# Patient Record
Sex: Male | Born: 1970 | Race: White | Hispanic: No | Smoking: Current some day smoker
Health system: Southern US, Community
[De-identification: ages and names within clinical notes are randomized; demographics above are authoritative.]

## PROBLEM LIST (undated history)

## (undated) DIAGNOSIS — M5412 Radiculopathy, cervical region: Secondary | ICD-10-CM

## (undated) DIAGNOSIS — Z8701 Personal history of pneumonia (recurrent): Secondary | ICD-10-CM

## (undated) DIAGNOSIS — K5792 Diverticulitis of intestine, part unspecified, without perforation or abscess without bleeding: Secondary | ICD-10-CM

## (undated) DIAGNOSIS — M7712 Lateral epicondylitis, left elbow: Secondary | ICD-10-CM

## (undated) DIAGNOSIS — M549 Dorsalgia, unspecified: Secondary | ICD-10-CM

## (undated) DIAGNOSIS — F32A Depression, unspecified: Secondary | ICD-10-CM

## (undated) DIAGNOSIS — I10 Essential (primary) hypertension: Secondary | ICD-10-CM

## (undated) DIAGNOSIS — M7711 Lateral epicondylitis, right elbow: Secondary | ICD-10-CM

## (undated) DIAGNOSIS — F329 Major depressive disorder, single episode, unspecified: Secondary | ICD-10-CM

## (undated) HISTORY — DX: Personal history of pneumonia (recurrent): Z87.01

## (undated) HISTORY — DX: Lateral epicondylitis, left elbow: M77.12

## (undated) HISTORY — DX: Dorsalgia, unspecified: M54.9

## (undated) HISTORY — DX: Depression, unspecified: F32.A

## (undated) HISTORY — DX: Lateral epicondylitis, right elbow: M77.11

## (undated) HISTORY — DX: Major depressive disorder, single episode, unspecified: F32.9

## (undated) HISTORY — DX: Essential (primary) hypertension: I10

## (undated) HISTORY — DX: Radiculopathy, cervical region: M54.12

## (undated) HISTORY — PX: TONSILLECTOMY: SHX5217

---

## 2009-06-07 DIAGNOSIS — F329 Major depressive disorder, single episode, unspecified: Secondary | ICD-10-CM

## 2009-06-07 DIAGNOSIS — F32A Depression, unspecified: Secondary | ICD-10-CM | POA: Insufficient documentation

## 2012-07-17 LAB — LIPID PANEL
Cholesterol: 170 mg/dL (ref 0–200)
HDL: 44 mg/dL (ref 35–70)
LDL Cholesterol: 100 mg/dL
Triglycerides: 129 mg/dL (ref 40–160)

## 2012-12-02 ENCOUNTER — Emergency Department: Payer: Self-pay | Admitting: Emergency Medicine

## 2012-12-02 LAB — COMPREHENSIVE METABOLIC PANEL
Alkaline Phosphatase: 76 U/L (ref 50–136)
BUN: 16 mg/dL (ref 7–18)
Bilirubin,Total: 0.4 mg/dL (ref 0.2–1.0)
Calcium, Total: 8.7 mg/dL (ref 8.5–10.1)
Creatinine: 1.14 mg/dL (ref 0.60–1.30)
EGFR (Non-African Amer.): >60
Glucose: 100 mg/dL — ABNORMAL HIGH (ref 65–99)
Osmolality: 275 (ref 275–301)
SGOT(AST): 24 U/L (ref 15–37)
SGPT (ALT): 27 U/L (ref 12–78)
Total Protein: 7.3 g/dL (ref 6.4–8.2)

## 2012-12-02 LAB — CBC
HGB: 15.8 g/dL (ref 13.0–18.0)
MCHC: 34.8 g/dL (ref 32.0–36.0)
MCV: 92 fL (ref 80–100)
Platelet: 226 10*3/uL (ref 150–440)
RBC: 4.98 10*6/uL (ref 4.40–5.90)
WBC: 9 10*3/uL (ref 3.8–10.6)

## 2014-06-28 ENCOUNTER — Other Ambulatory Visit: Payer: Self-pay | Admitting: Family Medicine

## 2014-06-30 ENCOUNTER — Ambulatory Visit (INDEPENDENT_AMBULATORY_CARE_PROVIDER_SITE_OTHER): Payer: BC Managed Care – PPO | Admitting: Family Medicine

## 2014-06-30 ENCOUNTER — Encounter: Payer: Self-pay | Admitting: Family Medicine

## 2014-06-30 ENCOUNTER — Encounter (INDEPENDENT_AMBULATORY_CARE_PROVIDER_SITE_OTHER): Payer: Self-pay

## 2014-06-30 VITALS — BP 134/88 | HR 71 | Temp 98.2°F | Resp 18 | Ht 67.75 in | Wt 185.8 lb

## 2014-06-30 DIAGNOSIS — M5412 Radiculopathy, cervical region: Secondary | ICD-10-CM | POA: Diagnosis not present

## 2014-06-30 DIAGNOSIS — Z113 Encounter for screening for infections with a predominantly sexual mode of transmission: Secondary | ICD-10-CM | POA: Diagnosis not present

## 2014-06-30 DIAGNOSIS — K625 Hemorrhage of anus and rectum: Secondary | ICD-10-CM | POA: Diagnosis not present

## 2014-06-30 DIAGNOSIS — Z8701 Personal history of pneumonia (recurrent): Secondary | ICD-10-CM | POA: Insufficient documentation

## 2014-06-30 DIAGNOSIS — Z720 Tobacco use: Secondary | ICD-10-CM | POA: Insufficient documentation

## 2014-06-30 DIAGNOSIS — M549 Dorsalgia, unspecified: Secondary | ICD-10-CM

## 2014-06-30 DIAGNOSIS — Z8679 Personal history of other diseases of the circulatory system: Secondary | ICD-10-CM | POA: Insufficient documentation

## 2014-06-30 DIAGNOSIS — M7710 Lateral epicondylitis, unspecified elbow: Secondary | ICD-10-CM | POA: Insufficient documentation

## 2014-06-30 DIAGNOSIS — Z125 Encounter for screening for malignant neoplasm of prostate: Secondary | ICD-10-CM | POA: Diagnosis not present

## 2014-06-30 DIAGNOSIS — Z0000 Encounter for general adult medical examination without abnormal findings: Secondary | ICD-10-CM

## 2014-06-30 DIAGNOSIS — G8929 Other chronic pain: Secondary | ICD-10-CM | POA: Insufficient documentation

## 2014-06-30 MED ORDER — CYCLOBENZAPRINE HCL 10 MG PO TABS: 10.0000 mg | ORAL_TABLET | Freq: Every day | ORAL | Status: DC

## 2014-06-30 NOTE — Progress Notes (Signed)
Name: Richard CrochetChristopher L Cimmino   MRN: [id number]    DOB: February 01, 1970   Date:06/30/2014       Progress Note  Subjective  Chief Complaint  Chief Complaint  Patient presents with  . Annual Exam    HPI  CPE: he is now seeing a new lady, and she has a history of herpes, he wants to be checked for STI's before becoming sexually active with her. He has a more sedentary job and has gained some weight.   Cervical Radiculitis: chronic, has daily neck pain, pain at this time is 5/10. Pain radiates to the upper back, pain is dull, sometimes he has numbness on left index finger, but no arm weakness, or coordination problems.  He takes Flexeril to help with spasms on neck and back and would like a refill  Patient Active Problem List   Diagnosis Date Noted  . Backhand tennis elbow 06/30/2014  . Back ache 06/30/2014  . Cervical radiculitis 06/30/2014  . H/O: pneumonia 06/30/2014  . History of hypertension 06/30/2014  . Compulsive tobacco user syndrome 06/30/2014  . Bleeding per rectum 06/30/2014    Past Surgical History  Procedure Laterality Date  . Tonsillectomy      Family History  Problem Relation Age of Onset  . Heart disease Mother   . Cancer Father     Gallbladder  . Hypertension Father     History   Social History  . Marital Status: Divorced    Spouse Name: N/A  . Number of Children: N/A  . Years of Education: N/A   Occupational History  . Not on file.   Social History Main Topics  . Smoking status: Current Some Day Smoker -- 0.25 packs/day for 4 years    Types: Cigarettes    Start date: 06/29/2009  . Smokeless tobacco: Never Used  . Alcohol Use: 4.8 oz/week    8 Standard drinks or equivalent per week  . Drug Use: No  . Sexual Activity:    Partners: Female   Other Topics Concern  . Not on file   Social History Narrative    No current outpatient prescriptions on file.  No Known Allergies   ROS  Constitutional: Negative for fever, gained some  weight Respiratory: Negative for cough and shortness of breath.   Cardiovascular: Negative for chest pain or palpitations.  Gastrointestinal: Negative for abdominal pain, no bowel changes.  Musculoskeletal: Negative for gait problem or joint swelling. back and neck pain  Skin: Negative for rash.  Neurological: Negative for dizziness or headache.  No other specific complaints in a complete review of systems (except as listed in HPI above).  Objective  Filed Vitals:   06/30/14 1357  BP: 134/88  Pulse: 71  Temp: 98.2 F (36.8 C)  TempSrc: Oral  Resp: 18  Height: 5' 7.75" (1.721 m)  Weight: 185 lb 12.8 oz (84.278 kg)  SpO2: 98%    Physical Exam   Constitutional: Patient appears well-developed and well-nourished. No distress.  HENT: Head: Normocephalic and atraumatic. Ears: B TMs ok, no erythema or effusion; Nose: Nose normal. Mouth/Throat: Oropharynx is clear and moist. No oropharyngeal exudate.  Eyes: Conjunctivae and EOM are normal. Pupils are equal, round, and reactive to light. No scleral icterus.  Neck: Normal range of motion. Neck supple. No JVD present. No thyromegaly present.  Cardiovascular: Normal rate, regular rhythm and normal heart sounds.  No murmur heard. No BLE edema. Pulmonary/Chest: Effort normal and breath sounds normal. No respiratory distress. Abdominal: Soft. Bowel sounds are  normal, no distension. There is no tenderness. no masses MALE GENITALIA: Normal descended testes bilaterally, no masses palpated, no hernias, no lesions, no discharge RECTAL: Prostate normal size and consistency, no rectal masses or hemorrhoids Musculoskeletal: Normal range of motion, no joint effusions. No gross deformities Neurological: he is alert and oriented to person, place, and time. No cranial nerve deficit. Coordination, balance, strength, speech and gait are normal.  Skin: Skin is warm and dry. No rash noted. No erythema.  Psychiatric: Patient has a normal mood and affect.  behavior is normal. Judgment and thought content normal.    Depression screen PHQ 2/9 06/30/2014  Decreased Interest 0  Down, Depressed, Hopeless 0  PHQ - 2 Score 0   Fall Risk: Fall Risk  06/30/2014  Falls in the past year? No    Assessment & Plan   1. Physical exam, annual Discussed importance of 150 minutes of physical activity weekly, eat two servings of fish weekly, eat one serving of tree nuts ( cashews, pistachios, pecans, almonds.Marland Kitchen) every other day, eat 6 servings of fruit/vegetables daily and drink plenty of water and avoid sweet beverages.  - Lipid Profile - Glucose  2. Bleeding per rectum Did not go see Dr. Lars Pinks, explained the need to have colonoscopy, he states has a new job, and will re-schedule when he can  3. Cervical radiculitis Refill of his flexeril   4. Routine screening for STI (sexually transmitted infection) Check labs, needs to use condoms.  - HIV antibody (with reflex) - HSV(herpes simplex vrs) 1+2 ab-IgG - RPR - GC Probe amplification, urine  5. Prostate cancer screening  Discussed current USPTF guidelines during CPE He does not want to have PSA today

## 2014-06-30 NOTE — Addendum Note (Signed)
Addended by: Alba CorySOWLES, Aleksa Collinsworth F on: 06/30/2014 03:32 PM   Modules accepted: Level of Service, SmartSet

## 2014-07-01 LAB — HSV(HERPES SIMPLEX VRS) I + II AB-IGG: HSV 1 GLYCOPROTEIN G AB, IGG: 3.51 {index} — AB (ref 0.00–0.90)

## 2014-07-01 LAB — LIPID PANEL
Chol/HDL Ratio: 5.6 ratio units — ABNORMAL HIGH (ref 0.0–5.0)
Cholesterol, Total: 173 mg/dL (ref 100–199)
HDL: 31 mg/dL — ABNORMAL LOW (ref >39)
LDL Calculated: 82 mg/dL (ref 0–99)
Triglycerides: 299 mg/dL — ABNORMAL HIGH (ref 0–149)
VLDL Cholesterol Cal: 60 mg/dL — ABNORMAL HIGH (ref 5–40)

## 2014-07-01 LAB — RPR: RPR: NONREACTIVE

## 2014-07-01 LAB — GLUCOSE, RANDOM: Glucose: 93 mg/dL (ref 65–99)

## 2014-07-01 LAB — HIV ANTIBODY (ROUTINE TESTING W REFLEX): HIV Screen 4th Generation wRfx: NONREACTIVE

## 2014-07-01 NOTE — Addendum Note (Signed)
Addended by: Alba CorySOWLES, Haruo Stepanek F on: 07/01/2014 01:27 PM   Modules accepted: Level of Service, SmartSet

## 2014-07-05 LAB — SPECIMEN STATUS REPORT

## 2014-07-05 LAB — GC/CHLAMYDIA PROBE AMP
Chlamydia trachomatis, NAA: NEGATIVE
NEISSERIA GONORRHOEAE BY PCR: NEGATIVE

## 2014-07-06 NOTE — Progress Notes (Signed)
[employer] and ask them to fax [place] the results, they are still pending in our system.

## 2014-07-07 ENCOUNTER — Telehealth: Payer: Self-pay

## 2014-07-07 NOTE — Telephone Encounter (Signed)
-----   Message from Alba Cory, MD sent at 07/06/2014  7:57 PM EDT ----- Other labs already reviewed.  Genprobe neg Please notify patient, thank you

## 2014-07-07 NOTE — Telephone Encounter (Signed)
Patient notified of labs.   

## 2015-01-01 ENCOUNTER — Ambulatory Visit: Payer: BC Managed Care – PPO | Admitting: Family Medicine

## 2015-01-15 ENCOUNTER — Encounter: Payer: Self-pay | Admitting: Family Medicine

## 2015-01-15 ENCOUNTER — Ambulatory Visit (INDEPENDENT_AMBULATORY_CARE_PROVIDER_SITE_OTHER): Payer: BC Managed Care – PPO | Admitting: Family Medicine

## 2015-01-15 VITALS — BP 128/86 | HR 75 | Temp 98.6°F | Resp 16 | Ht 68.0 in | Wt 193.7 lb

## 2015-01-15 DIAGNOSIS — M549 Dorsalgia, unspecified: Secondary | ICD-10-CM

## 2015-01-15 DIAGNOSIS — E663 Overweight: Secondary | ICD-10-CM | POA: Diagnosis not present

## 2015-01-15 DIAGNOSIS — M5412 Radiculopathy, cervical region: Secondary | ICD-10-CM

## 2015-01-15 DIAGNOSIS — E8881 Metabolic syndrome: Secondary | ICD-10-CM

## 2015-01-15 DIAGNOSIS — K625 Hemorrhage of anus and rectum: Secondary | ICD-10-CM

## 2015-01-15 DIAGNOSIS — G8929 Other chronic pain: Secondary | ICD-10-CM

## 2015-01-15 MED ORDER — CYCLOBENZAPRINE HCL 10 MG PO TABS: 10.0000 mg | ORAL_TABLET | Freq: Every day | ORAL | Status: DC

## 2015-01-15 NOTE — Patient Instructions (Signed)
Diet for Metabolic Syndrome Metabolic syndrome is a disorder that includes at least three of these conditions:  Abdominal obesity.  Too much sugar in your blood.  High blood pressure.  Higher than normal amount of fat (lipids) in your blood.  Lower than normal level of "good" cholesterol (HDL). Following a healthy diet can help to keep metabolic syndrome under control. It can also help to prevent the development of conditions that are associated with metabolic syndrome, such as diabetes, heart disease, and stroke. Along with exercise, a healthy diet:  Helps to improve the way that the body uses insulin.  Promotes weight loss. A common goal for people with this condition is to lose at least 7 to 10 percent of their starting weight. WHAT DO I NEED TO KNOW ABOUT THIS DIET?  Use the glycemic index (GI) to plan your meals. The index tells you how quickly a food will raise your blood sugar. Choose foods that have low GI values. These foods take a longer time to raise blood sugar.  Keep track of how many calories you take in. Eating the right amount of calories will help your achieve a healthy weight.  You may want to follow a Mediterranean diet. This diet includes lots of vegetables, lean meats or fish, whole grains, fruits, and healthy oils and fats. WHAT FOODS CAN I EAT? Grains Stone-ground whole wheat. Pumpernickel bread. Whole-grain bread, crackers, tortillas, cereal, and pasta. Unsweetened oatmeal.Bulgur.Barley.Quinoa.Brown rice or wild rice. Vegetables Lettuce. Spinach. Peas. Beets. Cauliflower. Cabbage. Broccoli. Carrots. Tomatoes. Squash. Eggplant. Herbs. Peppers. Onions. Cucumbers. Brussels sprouts. Sweet potatoes. Yams. Beans. Lentils. Fruits Berries. Apples. Oranges. Grapes. Mango. Pomegranate. Kiwi. Cherries. Meats and Other Protein Sources Seafood and shellfish. Lean meats.Poultry. Tofu. Dairy Low-fat or fat-free dairy products, such as milk, yogurt, and  cheese. Beverages Water. Low-fat milk. Milk alternatives, like soy milk or almond milk. Real fruit juice. Condiments Low-sugar or sugar-free ketchup, barbecue sauce, and mayonnaise. Mustard. Relish. Fats and Oils Avocado. Canola or olive oil. Nuts and nut butters.Seeds. The items listed above may not be a complete list of recommended foods or beverages. Contact your dietitian for more options.  WHAT FOODS ARE NOT RECOMMENDED? Red meat. Palm oil and coconut oil. Processed foods. Fried foods. Alcohol. Sweetened drinks, such as iced tea and soda. Sweets. Salty foods. The items listed above may not be a complete list of foods and beverages to avoid. Contact your dietitian for more information.   This information is not intended to replace advice given to you by your health care provider. Make sure you discuss any questions you have with your health care provider.   Document Released: 05/26/2014 Document Reviewed: 05/26/2014 Elsevier Interactive Patient Education 2016 [employer] Inc.  

## 2015-01-15 NOTE — Progress Notes (Signed)
Name: Richard Wilkerson   MRN: [id number]    DOB: 1970-07-17   Date:01/15/2015       Progress Note  Subjective  Chief Complaint  Chief Complaint  Patient presents with  . Medication Refill    6 month F/U   . Neck Pain    Only takes muscle relaxers as needed, comes from job from repeatively motions.   . Rectal Bleeding    Still has small amounts of blood from rectal every once in awhile, when lifting or carrying something too heavy.    HPI  Rectal bleeding: intermittently for the past year. He states it happens when he wipes or on toilett bowel , it does not seem to be mixed in stools. He denies straining, no constipation, Bristol scale of 4. He has not lost weight and has normal appetite. He has noticed some LLQ pain. No fever, chills.   Chronic back pain : he has mild daily pain on lower back, no radiculitis, pain is described as aching like, 2/10.   Neck pain : radiculitis has resolved, has some pain on left neck, feels stiff, mild right now, takes flexeril prn  Metabolic Syndrome: Low HDL, elevated BMI and elevated triglycerides. He has gained more weight since last visit. Not following a healthy diet and is not physically active. No polyphagia, polyuria or polydipsia  Patient Active Problem List   Diagnosis Date Noted  . Overweight (BMI 25.0-29.9) 01/15/2015  . Backhand tennis elbow 06/30/2014  . Chronic back pain 06/30/2014  . Cervical radiculitis 06/30/2014  . H/O: pneumonia 06/30/2014  . History of hypertension 06/30/2014  . Tobacco use 06/30/2014  . Bleeding per rectum 06/30/2014    Past Surgical History  Procedure Laterality Date  . Tonsillectomy      Family History  Problem Relation Age of Onset  . Heart disease Mother   . Cancer Father     Gallbladder  . Hypertension Father     Social History   Social History  . Marital Status: Divorced    Spouse Name: N/A  . Number of Children: N/A  . Years of Education: N/A   Occupational History  . Not on  file.   Social History Main Topics  . Smoking status: Current Some Day Smoker -- 0.25 packs/day for 4 years    Types: Cigarettes    Start date: 06/29/2009  . Smokeless tobacco: Never Used  . Alcohol Use: 4.8 oz/week    8 Standard drinks or equivalent per week  . Drug Use: No  . Sexual Activity:    Partners: Female   Other Topics Concern  . Not on file   Social History Narrative     Current outpatient prescriptions:  .  cyclobenzaprine (FLEXERIL) 10 MG tablet, Take 1 tablet (10 mg total) by mouth at bedtime., Disp: 90 tablet, Rfl: 1  No Known Allergies   ROS  Constitutional: Negative for fever, positive  weight change.  Respiratory: Negative for cough and shortness of breath.   Cardiovascular: Negative for chest pain or palpitations.  Gastrointestinal: Positive  for abdominal pain, denies change of  bowel changes.  Musculoskeletal: Negative for gait problem or joint swelling.  Skin: Negative for rash.  Neurological: Negative for dizziness or headache.  No other specific complaints in a complete review of systems (except as listed in HPI above).  Objective  Filed Vitals:   01/15/15 1612  BP: 128/86  Pulse: 75  Temp: 98.6 F (37 C)  TempSrc: Oral  Resp: 16  Height:  5\' 8"  (1.727 m)  Weight: 193 lb 11.2 oz (87.862 kg)  SpO2: 96%    Body mass index is 29.46 kg/(m^2).  Physical Exam  Constitutional: Patient appears well-developed and well-nourished. Overweight No distress.  HEENT: head atraumatic, normocephalic, pupils equal and reactive to light, neck supple, throat within normal limits Cardiovascular: Normal rate, regular rhythm and normal heart sounds.  No murmur heard. No BLE edema. Pulmonary/Chest: Effort normal and breath sounds normal. No respiratory distress. Abdominal: Soft.  There is tenderness on LLQ during palpation  Psychiatric: Patient has a normal mood and affect. behavior is normal. Judgment and thought content normal Muscular Skeletal: normal  back exam and neck exam today  PHQ2/9: [facility] 01/15/2015 06/30/2014  Decreased Interest 0 0  Down, Depressed, Hopeless 0 0  PHQ - 2 Score 0 0    Fall Risk: Fall Risk  01/15/2015 06/30/2014  Falls in the past year? No No    Functional Status Survey: Is the patient deaf or have difficulty hearing?: No Does the patient have difficulty seeing, even when wearing glasses/contacts?: No Does the patient have difficulty concentrating, remembering, or making decisions?: No Does the patient have difficulty walking or climbing stairs?: No Does the patient have difficulty dressing or bathing?: No Does the patient have difficulty doing errands alone such as visiting a doctor's office or shopping?: No   Assessment & Plan  1. Bleeding per rectum  - Ambulatory referral to Gastroenterology  2. Overweight (BMI 25.0-29.9)  Discussed diet and exercise  3. Chronic back pain  - cyclobenzaprine (FLEXERIL) 10 MG tablet; Take 1 tablet (10 mg total) by mouth at bedtime.  Dispense: 90 tablet; Refill: 1  4. Cervical radiculitis  Stable   5. Metabolic syndrome  Discussed diet and exercise, he needs to lose weight

## 2015-02-18 ENCOUNTER — Ambulatory Visit (INDEPENDENT_AMBULATORY_CARE_PROVIDER_SITE_OTHER): Payer: BC Managed Care – PPO | Admitting: Gastroenterology

## 2015-02-18 ENCOUNTER — Other Ambulatory Visit: Payer: Self-pay

## 2015-02-18 ENCOUNTER — Encounter (INDEPENDENT_AMBULATORY_CARE_PROVIDER_SITE_OTHER): Payer: Self-pay

## 2015-02-18 ENCOUNTER — Encounter: Payer: Self-pay | Admitting: Gastroenterology

## 2015-02-18 VITALS — BP 140/85 | HR 64 | Ht 68.0 in | Wt 196.0 lb

## 2015-02-18 DIAGNOSIS — K921 Melena: Secondary | ICD-10-CM | POA: Diagnosis not present

## 2015-02-18 NOTE — Progress Notes (Signed)
Gastroenterology Consultation  Referring Provider:     Alba Cory, MD Primary Care Physician:  Ruel Favors, MD Primary Gastroenterologist:  Dr. Servando Snare     Reason for Consultation:     Rectal bleeding        HPI:   DELVECCHIO Wilkerson is a 45 y.o. y/o male referred for consultation & management of  Rectal bleeding by Dr. Ruel Favors, MD.   This patient reports that he has been having rectal bleeding off and on for six months. The patient denies any constipation associated with the rectal bleeding. He states that there blood is usually bright red that can be darker at times. There is no report of any unexplained weight loss. The patient also denies any abdominal pain or black stools. He denies any family history of colon cancer colon polyps. The patient cannot recall any exacerbating were relieving things that he can do to make the rectal bleeding occur or stop. He denies any rectal bleeding when he is not having a bowel movement.  Past Medical History  Diagnosis Date  . Hypertension   . Depression   . Cervical radiculopathy   . Back pain   . Bilateral tennis elbow   . History of pneumonia     Past Surgical History  Procedure Laterality Date  . Tonsillectomy      Prior to Admission medications   Not on File    Family History  Problem Relation Age of Onset  . Heart disease Mother   . Cancer Father     Gallbladder  . Hypertension Father      Social History  Substance Use Topics  . Smoking status: Current Some Day Smoker -- 0.25 packs/day for 4 years    Types: Cigarettes    Start date: 06/29/2009  . Smokeless tobacco: Never Used  . Alcohol Use: 4.8 oz/week    8 Standard drinks or equivalent per week    Allergies as of 02/18/2015  . (No Known Allergies)    Review of Systems:    All systems reviewed and negative except where noted in HPI.   Physical Exam:  BP 140/85 mmHg  Pulse 64  Ht  (1.727 m)  Wt 196 lb (88.905 kg)  BMI 29.81 kg/m2 No  LMP for male patient. Psych:  Alert and cooperative. Normal mood and affect. General:   Alert,  Well-developed, well-nourished, pleasant and cooperative in NAD Head:  Normocephalic and atraumatic. Eyes:  Sclera clear, no icterus.   Conjunctiva pink. Ears:  Normal auditory acuity. Nose:  No deformity, discharge, or lesions. Mouth:  No deformity or lesions,oropharynx pink & moist. Neck:  Supple; no masses or thyromegaly. Lungs:  Respirations even and unlabored.  Clear throughout to auscultation.   No wheezes, crackles, or rhonchi. No acute distress. Heart:  Regular rate and rhythm; no murmurs, clicks, rubs, or gallops. Abdomen:  Normal bowel sounds.  No bruits.  Soft, non-tender and non-distended without masses, hepatosplenomegaly or hernias noted.  No guarding or rebound tenderness.  Negative Carnett sign.   Rectal:  Deferred.  Msk:  Symmetrical without gross deformities.  Good, equal movement & strength bilaterally. Pulses:  Normal pulses noted. Extremities:  No clubbing or edema.  No cyanosis. Neurologic:  Alert and oriented x3;  grossly normal neurologically. Skin:  Intact without significant lesions or rashes.  No jaundice. Lymph Nodes:  No significant cervical adenopathy. Psych:  Alert and cooperative. Normal mood and affect.  Imaging Studies: No results found.  Assessment  and Plan:   Richard Wilkerson is a 45 y.o. y/o male who comes in today with a history of six months of rectal bleeding. The patient denies any family history of colon cancer or polyps. He also denies any change in bowel habits or unexplained weight loss. The patient will be set up for colonoscopy to evaluate the colon for source of his rectal bleeding. The patient has been explained the planet agrees with it. I have discussed risks & benefits which include, but are not limited to, bleeding, infection, perforation & drug reaction.  The patient agrees with this plan & written consent will be obtained.      Note:  This dictation was prepared with Dragon dictation along with smaller phrase technology. Any transcriptional errors that result from this process are unintentional.

## 2015-03-15 ENCOUNTER — Encounter: Admission: RE | Payer: Self-pay | Source: Ambulatory Visit

## 2015-03-15 ENCOUNTER — Ambulatory Visit
Admission: RE | Admit: 2015-03-15 | Payer: BC Managed Care – PPO | Source: Ambulatory Visit | Admitting: Gastroenterology

## 2015-03-15 SURGERY — COLONOSCOPY WITH PROPOFOL
Anesthesia: Choice

## 2015-04-16 ENCOUNTER — Ambulatory Visit: Payer: BC Managed Care – PPO | Admitting: Family Medicine

## 2015-07-01 ENCOUNTER — Telehealth: Payer: Self-pay | Admitting: Family Medicine

## 2015-07-01 NOTE — Telephone Encounter (Signed)
ERRONOUS  

## 2018-10-09 ENCOUNTER — Ambulatory Visit
Admission: RE | Admit: 2018-10-09 | Discharge: 2018-10-09 | Disposition: A | Discharge status: Home / Self Care | Payer: BC Managed Care – PPO | Source: Ambulatory Visit | Attending: Gastroenterology | Admitting: Gastroenterology

## 2018-10-09 ENCOUNTER — Other Ambulatory Visit: Payer: Self-pay | Admitting: Gastroenterology

## 2018-10-09 ENCOUNTER — Other Ambulatory Visit: Payer: Self-pay

## 2018-10-09 DIAGNOSIS — R1032 Left lower quadrant pain: Secondary | ICD-10-CM | POA: Diagnosis not present

## 2018-10-09 MED ORDER — IOHEXOL 300 MG/ML  SOLN
100.0000 mL | Freq: Once | INTRAMUSCULAR | Status: AC | PRN
  Administered 2018-10-09: 100 mL via INTRAVENOUS

## 2018-12-09 ENCOUNTER — Other Ambulatory Visit: Payer: Self-pay

## 2018-12-09 ENCOUNTER — Other Ambulatory Visit
Admission: RE | Admit: 2018-12-09 | Discharge: 2018-12-09 | Disposition: A | Discharge status: Home / Self Care | Payer: BC Managed Care – PPO | Source: Ambulatory Visit | Attending: Surgery | Admitting: Surgery

## 2018-12-09 DIAGNOSIS — Z01812 Encounter for preprocedural laboratory examination: Secondary | ICD-10-CM | POA: Insufficient documentation

## 2018-12-09 DIAGNOSIS — Z20828 Contact with and (suspected) exposure to other viral communicable diseases: Secondary | ICD-10-CM | POA: Insufficient documentation

## 2018-12-09 LAB — SARS CORONAVIRUS 2 (TAT 6-24 HRS): SARS Coronavirus 2: NEGATIVE

## 2018-12-12 ENCOUNTER — Ambulatory Visit: Payer: BC Managed Care – PPO | Admitting: Certified Registered Nurse Anesthetist

## 2018-12-12 ENCOUNTER — Ambulatory Visit
Admission: RE | Admit: 2018-12-12 | Discharge: 2018-12-12 | Disposition: A | Discharge status: Home / Self Care | Payer: BC Managed Care – PPO | Attending: Surgery | Admitting: Surgery

## 2018-12-12 ENCOUNTER — Encounter
Admission: RE | Disposition: A | Discharge status: Home / Self Care | Payer: Self-pay | Source: Home / Self Care | Attending: Surgery

## 2018-12-12 ENCOUNTER — Other Ambulatory Visit: Payer: Self-pay

## 2018-12-12 ENCOUNTER — Encounter: Payer: Self-pay | Admitting: *Deleted

## 2018-12-12 DIAGNOSIS — K635 Polyp of colon: Secondary | ICD-10-CM | POA: Insufficient documentation

## 2018-12-12 DIAGNOSIS — I10 Essential (primary) hypertension: Secondary | ICD-10-CM | POA: Insufficient documentation

## 2018-12-12 DIAGNOSIS — K640 First degree hemorrhoids: Secondary | ICD-10-CM | POA: Diagnosis not present

## 2018-12-12 DIAGNOSIS — K5720 Diverticulitis of large intestine with perforation and abscess without bleeding: Secondary | ICD-10-CM | POA: Insufficient documentation

## 2018-12-12 DIAGNOSIS — F17210 Nicotine dependence, cigarettes, uncomplicated: Secondary | ICD-10-CM | POA: Insufficient documentation

## 2018-12-12 HISTORY — PX: COLONOSCOPY WITH PROPOFOL: SHX5780

## 2018-12-12 SURGERY — COLONOSCOPY WITH PROPOFOL
Anesthesia: General

## 2018-12-12 MED ORDER — MIDAZOLAM HCL 2 MG/2ML IJ SOLN
INTRAMUSCULAR | Status: AC
  Filled 2018-12-12: qty 2

## 2018-12-12 MED ORDER — PROPOFOL 500 MG/50ML IV EMUL
INTRAVENOUS | Status: DC | PRN
  Administered 2018-12-12: 130 ug/kg/min via INTRAVENOUS

## 2018-12-12 MED ORDER — LIDOCAINE HCL (PF) 2 % IJ SOLN
INTRAMUSCULAR | Status: AC
  Filled 2018-12-12: qty 10

## 2018-12-12 MED ORDER — PROPOFOL 10 MG/ML IV BOLUS
INTRAVENOUS | Status: DC | PRN
  Administered 2018-12-12: 80 mg via INTRAVENOUS

## 2018-12-12 MED ORDER — PROPOFOL 500 MG/50ML IV EMUL
INTRAVENOUS | Status: AC
  Filled 2018-12-12: qty 50

## 2018-12-12 MED ORDER — MIDAZOLAM HCL 2 MG/2ML IJ SOLN
INTRAMUSCULAR | Status: DC | PRN
  Administered 2018-12-12: 2 mg via INTRAVENOUS

## 2018-12-12 MED ORDER — LIDOCAINE HCL (CARDIAC) PF 100 MG/5ML IV SOSY
PREFILLED_SYRINGE | INTRAVENOUS | Status: DC | PRN
  Administered 2018-12-12: 50 mg via INTRAVENOUS

## 2018-12-12 MED ORDER — SODIUM CHLORIDE 0.9 % IV SOLN
INTRAVENOUS | Status: DC
  Administered 2018-12-12: 1000 mL via INTRAVENOUS

## 2018-12-12 NOTE — Interval H&P Note (Signed)
History and Physical Interval Note:  12/12/2018 12:34 PM  Richard Wilkerson  has presented today for surgery, with the diagnosis of DIVERTICULITIS WITH ABSCESS.  The various methods of treatment have been discussed with the patient and family. After consideration of risks, benefits and other options for treatment, the patient has consented to  Procedure(s): COLONOSCOPY WITH PROPOFOL (N/A) as a surgical intervention.  The patient's history has been reviewed, patient examined, no change in status, stable for surgery.  I have reviewed the patient's chart and labs.  Questions were answered to the patient's satisfaction.     Dail Lerew Lysle Pearl

## 2018-12-12 NOTE — Anesthesia Preprocedure Evaluation (Signed)
Anesthesia Evaluation  Patient identified by MRN, date of birth, ID band Patient awake    Reviewed: Allergy & Precautions, H&P , NPO status , Patient's Chart, lab work & pertinent test results, reviewed documented beta blocker date and time   History of Anesthesia Complications Negative for: history of anesthetic complications  Airway Mallampati: I  TM Distance: >3 FB Neck ROM: full    Dental  (+) Dental Advidsory Given, Caps, Teeth Intact, Missing   Pulmonary neg shortness of breath, neg COPD, neg recent URI, Current Smoker,    Pulmonary exam normal        Cardiovascular Exercise Tolerance: Good hypertension, (-) angina(-) Past MI and (-) Cardiac Stents Normal cardiovascular exam(-) dysrhythmias (-) Valvular Problems/Murmurs     Neuro/Psych PSYCHIATRIC DISORDERS Depression negative neurological ROS     GI/Hepatic negative GI ROS, Neg liver ROS,   Endo/Other  negative endocrine ROS  Renal/GU negative Renal ROS  negative genitourinary   Musculoskeletal   Abdominal   Peds  Hematology negative hematology ROS (+)   Anesthesia Other Findings Past Medical History: No date: Back pain No date: Bilateral tennis elbow No date: Cervical radiculopathy No date: Depression No date: History of pneumonia No date: Hypertension   Reproductive/Obstetrics negative OB ROS                             Anesthesia Physical Anesthesia Plan  ASA: II  Anesthesia Plan: General   Post-op Pain Management:    Induction: Intravenous  PONV Risk Score and Plan: 1 and Propofol infusion and TIVA  Airway Management Planned: Natural Airway and Nasal Cannula  Additional Equipment:   Intra-op Plan:   Post-operative Plan:   Informed Consent: I have reviewed the patients History and Physical, chart, labs and discussed the procedure including the risks, benefits and alternatives for the proposed anesthesia with  the patient or authorized representative who has indicated his/her understanding and acceptance.     Dental Advisory Given  Plan Discussed with: Anesthesiologist, CRNA and Surgeon  Anesthesia Plan Comments:         Anesthesia Quick Evaluation

## 2018-12-12 NOTE — Anesthesia Post-op Follow-up Note (Signed)
Anesthesia QCDR form completed.        

## 2018-12-12 NOTE — Transfer of Care (Signed)
Immediate Anesthesia Transfer of Care Note  Patient: Richard Wilkerson  Procedure(s) Performed: COLONOSCOPY WITH PROPOFOL (N/A )  Patient Location: PACU and Endoscopy Unit  Anesthesia Type:General  Level of Consciousness: drowsy  Airway & Oxygen Therapy: Patient Spontanous Breathing and Patient connected to nasal cannula oxygen  Post-op Assessment: Report given to RN and Post -op Vital signs reviewed and stable  Post vital signs: Reviewed and stable  Last Vitals:  Vitals Value Taken Time  BP 126/91 12/12/18 1313  Temp 36.1 C 12/12/18 1313  Pulse 84 12/12/18 1316  Resp 21 12/12/18 1316  SpO2 96 % 12/12/18 1316  Vitals shown include unvalidated device data.  Last Pain:  Vitals:   12/12/18 1313  TempSrc: Temporal         Complications: No apparent anesthesia complications

## 2018-12-12 NOTE — Anesthesia Postprocedure Evaluation (Signed)
Anesthesia Post Note  Patient: Richard Wilkerson  Procedure(s) Performed: COLONOSCOPY WITH PROPOFOL (N/A )  Patient location during evaluation: Endoscopy Anesthesia Type: General Level of consciousness: awake and alert Pain management: pain level controlled Vital Signs Assessment: post-procedure vital signs reviewed and stable Respiratory status: spontaneous breathing, nonlabored ventilation, respiratory function stable and patient connected to nasal cannula oxygen Cardiovascular status: blood pressure returned to baseline and stable Postop Assessment: no apparent nausea or vomiting Anesthetic complications: no     Last Vitals:  Vitals:   12/12/18 1205 12/12/18 1313  BP: (!) 125/98 (!) 126/91  Pulse: 69   Resp: 16   Temp: (!) 36.2 C (!) 36.1 C  SpO2: 100%     Last Pain:  Vitals:   12/12/18 1323  TempSrc:   PainSc: 0-No pain                 Martha Clan

## 2018-12-12 NOTE — Op Note (Signed)
[facility] Gastroenterology Patient Name: Richard PilgrimChristopher Wilkerson Procedure Date: 12/12/2018 11:07 AM MRN: [id number] Account #: [id number] Date of Birth: [birth year] Admit Type: Outpatient Age: [age] [facility] Gender: Male Note Status: Finalized Procedure:             Colonoscopy Indications:           Follow-up of diverticulitis Providers:             Arville GoIsami Sakia MD, MD Medicines:             Propofol per Anesthesia Complications:         No immediate complications. Procedure:             Pre-Anesthesia Assessment:                        - After reviewing the risks and benefits, the patient                         was deemed in satisfactory condition to undergo the                         procedure in an ambulatory setting.                        After obtaining informed consent, the colonoscope was                         passed under direct vision. Throughout the procedure,                         the patient's blood pressure, pulse, and oxygen                         saturations were monitored continuously. The                         Colonoscope was introduced through the anus and                         advanced to the the cecum, identified by the ileocecal                         valve. The colonoscopy was performed without                         difficulty. The patient tolerated the procedure well.                         The quality of the bowel preparation was good. Findings:      The perianal and digital rectal examinations were normal.      Multiple small and large-mouthed diverticula were found in the sigmoid       colon and descending colon.      Non-bleeding internal hemorrhoids were found during retroflexion. The       hemorrhoids were Grade I (internal hemorrhoids that do not prolapse).      A 3 mm polyp was found in the sigmoid colon. The polyp was sessile. The       polyp was removed with a cold snare. Resection and  retrieval were    complete.      The exam was otherwise without abnormality. Impression:            - Diverticulosis in the sigmoid colon and in the                         descending colon.                        - Non-bleeding internal hemorrhoids.                        - One 3 mm polyp in the sigmoid colon, removed with a                         cold snare. Resected and retrieved.                        - The examination was otherwise normal. Recommendation:        - Await pathology results.                        - Written discharge instructions were provided to the                         patient.                        - Resume regular diet.                        - Discharge patient to home. Procedure Code(s):     --- Professional ---                        [phone], Colonoscopy, flexible; with removal of                         tumor(s), polyp(s), or other lesion(s) by snare                         technique Diagnosis Code(s):     --- Professional ---                        K64.0, First degree hemorrhoids                        K63.5, Polyp of colon                        K57.32, Diverticulitis of large intestine without                         perforation or abscess without bleeding                        K57.30, Diverticulosis of large intestine without                         perforation or abscess without bleeding CPT copyright 2019 American Medical Association. All rights reserved. The codes documented in this report are preliminary and upon coder  review may  be revised to meet current compliance requirements. Dr. Sheppard Penton, MD Eliseo Squires MD, MD 12/12/2018 1:11:47 PM This report has been signed electronically. Number of Addenda: 0 Note Initiated On: 12/12/2018 11:07 AM Scope Withdrawal Time: 0 hours 14 minutes 7 seconds  Total Procedure Duration: 0 hours 21 minutes 27 seconds  Estimated Blood Loss:  Estimated blood loss was minimal.      [facility]

## 2018-12-12 NOTE — H&P (Signed)
Subjective:   CC: Diverticulitis of large intestine with abscess without bleeding [K57.20]  HPI: Richard Wilkerson is a 48 y.o. male who was referred by Dewayne Hatch,* for evaluation of above. First noted several weeks ago. Required admission, IR drain for abscess, and another round of outpt abx due to persistent  symptoms.  Completed second course week ago, still has vague discomfort in area, but otherwise tolerating diet, and having normal BMs  Past Medical History: has a past medical history of Chicken pox.  Past Surgical History: has a past surgical history that includes Tonsillectomy and corneal eye surgery.  Family History: family history includes No Known Problems in his father and mother.  Social History: reports that he has been smoking cigarettes. He has never used smokeless tobacco. He reports current alcohol use. He reports that he does not use drugs.  Current Medications: has a current medication list which includes the following prescription(s): docusate.  Allergies:  No Known Allergies  ROS:  A 15 point review of systems was performed and pertinent positives and negatives noted in HPI  Objective:    BP 120/88  Pulse 85  Ht 172.7 cm (5\' 8" )  Wt 82.1 kg (181 lb)  BMI 27.52 kg/m   Constitutional : alert, appears stated age, cooperative and no distress  Lymphatics/Throat: no asymmetry, masses, or scars  Respiratory: clear to auscultation bilaterally  Cardiovascular: regular rate and rhythm  Gastrointestinal: soft, non-tender; bowel sounds normal; no masses, no organomegaly.  Musculoskeletal: Steady gait and movement  Skin: Cool and moist.  Psychiatric: Normal affect, non-agitated, not confused    LABS:  n/a  RADS: n/a  Assessment:    Diverticulitis of large intestine with abscess without bleeding [K57.20]  Plan:   symptoms essentially resolved. Will schedule for interval cscope sometime mid-late Nov. ED precautions discussed in  meantime. Risks include bleeding, perforation. Alternatives include observation. Benefits include diagnosis of occult disease if present.  He verbalized understanding and wishes to proceed with colonoscopy.

## 2018-12-13 ENCOUNTER — Encounter: Payer: Self-pay | Admitting: Surgery

## 2018-12-13 LAB — SURGICAL PATHOLOGY

## 2019-06-16 ENCOUNTER — Inpatient Hospital Stay
Admission: EM | Admit: 2019-06-16 | Discharge: 2019-06-19 | DRG: 392 | Disposition: A | Discharge status: Home / Self Care | Payer: BC Managed Care – PPO | Attending: Surgery | Admitting: Surgery

## 2019-06-16 ENCOUNTER — Other Ambulatory Visit: Payer: Self-pay

## 2019-06-16 ENCOUNTER — Encounter: Payer: Self-pay | Admitting: Emergency Medicine

## 2019-06-16 ENCOUNTER — Emergency Department: Payer: BC Managed Care – PPO

## 2019-06-16 DIAGNOSIS — F17210 Nicotine dependence, cigarettes, uncomplicated: Secondary | ICD-10-CM | POA: Diagnosis present

## 2019-06-16 DIAGNOSIS — Z8249 Family history of ischemic heart disease and other diseases of the circulatory system: Secondary | ICD-10-CM

## 2019-06-16 DIAGNOSIS — Z20822 Contact with and (suspected) exposure to covid-19: Secondary | ICD-10-CM | POA: Diagnosis present

## 2019-06-16 DIAGNOSIS — K5720 Diverticulitis of large intestine with perforation and abscess without bleeding: Principal | ICD-10-CM | POA: Diagnosis present

## 2019-06-16 DIAGNOSIS — I10 Essential (primary) hypertension: Secondary | ICD-10-CM | POA: Diagnosis present

## 2019-06-16 DIAGNOSIS — K5792 Diverticulitis of intestine, part unspecified, without perforation or abscess without bleeding: Secondary | ICD-10-CM | POA: Diagnosis present

## 2019-06-16 DIAGNOSIS — Z8701 Personal history of pneumonia (recurrent): Secondary | ICD-10-CM

## 2019-06-16 DIAGNOSIS — R1030 Lower abdominal pain, unspecified: Secondary | ICD-10-CM

## 2019-06-16 DIAGNOSIS — K5732 Diverticulitis of large intestine without perforation or abscess without bleeding: Secondary | ICD-10-CM

## 2019-06-16 HISTORY — DX: Diverticulitis of intestine, part unspecified, without perforation or abscess without bleeding: K57.92

## 2019-06-16 LAB — CBC
HCT: 45.9 % (ref 39.0–52.0)
Hemoglobin: 15.7 g/dL (ref 13.0–17.0)
MCH: 31.1 pg (ref 26.0–34.0)
MCHC: 34.2 g/dL (ref 30.0–36.0)
MCV: 90.9 fL (ref 80.0–100.0)
Platelets: 202 10*3/uL (ref 150–400)
RBC: 5.05 MIL/uL (ref 4.22–5.81)
RDW: 11.6 % (ref 11.5–15.5)
WBC: 12.5 10*3/uL — ABNORMAL HIGH (ref 4.0–10.5)
nRBC: 0 % (ref 0.0–0.2)

## 2019-06-16 LAB — COMPREHENSIVE METABOLIC PANEL
ALT: 13 U/L (ref 0–44)
AST: 15 U/L (ref 15–41)
Albumin: 4.2 g/dL (ref 3.5–5.0)
Alkaline Phosphatase: 71 U/L (ref 38–126)
Anion gap: 7 (ref 5–15)
BUN: 15 mg/dL (ref 6–20)
CO2: 25 mmol/L (ref 22–32)
Calcium: 8.8 mg/dL — ABNORMAL LOW (ref 8.9–10.3)
Chloride: 104 mmol/L (ref 98–111)
Creatinine, Ser: 1.11 mg/dL (ref 0.61–1.24)
GFR calc Af Amer: >60 mL/min (ref >60)
GFR calc non Af Amer: >60 mL/min (ref >60)
Glucose, Bld: 114 mg/dL — ABNORMAL HIGH (ref 70–99)
Potassium: 4.4 mmol/L (ref 3.5–5.1)
Sodium: 136 mmol/L (ref 135–145)
Total Bilirubin: 0.8 mg/dL (ref 0.3–1.2)
Total Protein: 7.2 g/dL (ref 6.5–8.1)

## 2019-06-16 LAB — URINALYSIS, COMPLETE (UACMP) WITH MICROSCOPIC
Bacteria, UA: NONE SEEN
Bilirubin Urine: NEGATIVE
Glucose, UA: NEGATIVE mg/dL
Hgb urine dipstick: NEGATIVE
Ketones, ur: NEGATIVE mg/dL
Leukocytes,Ua: NEGATIVE
Nitrite: NEGATIVE
Protein, ur: NEGATIVE mg/dL
Specific Gravity, Urine: 1.024 (ref 1.005–1.030)
Squamous Epithelial / HPF: NONE SEEN (ref 0–5)
pH: 7 (ref 5.0–8.0)

## 2019-06-16 LAB — HIV ANTIBODY (ROUTINE TESTING W REFLEX): HIV Screen 4th Generation wRfx: NONREACTIVE

## 2019-06-16 LAB — LIPASE, BLOOD: Lipase: 21 U/L (ref 11–51)

## 2019-06-16 LAB — SARS CORONAVIRUS 2 BY RT PCR (HOSPITAL ORDER, PERFORMED IN ~~LOC~~ HOSPITAL LAB): SARS Coronavirus 2: NEGATIVE

## 2019-06-16 MED ORDER — ACETAMINOPHEN 325 MG PO TABS
650.0000 mg | ORAL_TABLET | Freq: Four times a day (QID) | ORAL | Status: DC | PRN
  Administered 2019-06-19: 650 mg via ORAL
  Filled 2019-06-16: qty 2

## 2019-06-16 MED ORDER — ONDANSETRON HCL 4 MG/2ML IJ SOLN: 4.0000 mg | Freq: Four times a day (QID) | INTRAMUSCULAR | Status: DC | PRN

## 2019-06-16 MED ORDER — CIPROFLOXACIN IN D5W 400 MG/200ML IV SOLN
400.0000 mg | Freq: Once | INTRAVENOUS | Status: AC
  Administered 2019-06-16: 400 mg via INTRAVENOUS
  Filled 2019-06-16: qty 200

## 2019-06-16 MED ORDER — ENOXAPARIN SODIUM 40 MG/0.4ML ~~LOC~~ SOLN
40.0000 mg | SUBCUTANEOUS | Status: DC
  Filled 2019-06-16: qty 0.4

## 2019-06-16 MED ORDER — SODIUM CHLORIDE 0.9 % IV SOLN: INTRAVENOUS | Status: DC

## 2019-06-16 MED ORDER — PIPERACILLIN-TAZOBACTAM 3.375 G IVPB
3.3750 g | Freq: Three times a day (TID) | INTRAVENOUS | Status: DC
  Administered 2019-06-16 – 2019-06-19 (×9): 3.375 g via INTRAVENOUS
  Filled 2019-06-16 (×9): qty 50

## 2019-06-16 MED ORDER — MORPHINE SULFATE (PF) 4 MG/ML IV SOLN: 4.0000 mg | INTRAVENOUS | Status: DC | PRN

## 2019-06-16 MED ORDER — ONDANSETRON 4 MG PO TBDP: 4.0000 mg | ORAL_TABLET | Freq: Four times a day (QID) | ORAL | Status: DC | PRN

## 2019-06-16 MED ORDER — METRONIDAZOLE IN NACL 5-0.79 MG/ML-% IV SOLN
500.0000 mg | Freq: Once | INTRAVENOUS | Status: AC
  Administered 2019-06-16: 500 mg via INTRAVENOUS
  Filled 2019-06-16: qty 100

## 2019-06-16 MED ORDER — PANTOPRAZOLE SODIUM 40 MG IV SOLR
40.0000 mg | Freq: Every day | INTRAVENOUS | Status: DC
  Administered 2019-06-16 – 2019-06-18 (×3): 40 mg via INTRAVENOUS
  Filled 2019-06-16 (×3): qty 40

## 2019-06-16 MED ORDER — IOHEXOL 300 MG/ML  SOLN
100.0000 mL | Freq: Once | INTRAMUSCULAR | Status: AC | PRN
  Administered 2019-06-16: 100 mL via INTRAVENOUS
  Filled 2019-06-16: qty 100

## 2019-06-16 MED ORDER — SODIUM CHLORIDE 0.9% FLUSH: 3.0000 mL | Freq: Once | INTRAVENOUS | Status: DC

## 2019-06-16 MED ORDER — HYDROCODONE-ACETAMINOPHEN 5-325 MG PO TABS
1.0000 | ORAL_TABLET | ORAL | Status: DC | PRN
  Administered 2019-06-18: 1 via ORAL
  Filled 2019-06-16: qty 1

## 2019-06-16 MED ORDER — ACETAMINOPHEN 650 MG RE SUPP: 650.0000 mg | Freq: Four times a day (QID) | RECTAL | Status: DC | PRN

## 2019-06-16 NOTE — ED Notes (Signed)
Pt given meal tray at this time 

## 2019-06-16 NOTE — ED Notes (Signed)
Pt transported to CT at this time.

## 2019-06-16 NOTE — ED Notes (Signed)
2nd abx initiated by this RN at this time, pt repositioned in bed by this RN. Pt given urinal. Pt denies further needs. Pt visualized in NAD at this time.

## 2019-06-16 NOTE — ED Notes (Signed)
Pt states had diverticulitis last year and had surgery, reports same pain that has started again, denies N/V/D, reports pain 5/10 that is sharp in nature. Denies urinary symptoms at this time. A&O x4, ambulatory from lobby to room with this RN. Lights dimmed for comfort, pt given remote to TV, call bell within reach of patient at this time.

## 2019-06-16 NOTE — ED Provider Notes (Signed)
[facility] Emergency Department Provider Note  ____________________________________________   First MD Initiated Contact with Patient 06/16/19 1159     (approximate)  I have reviewed the triage vital signs and the nursing notes.  History  Chief Complaint Abdominal Pain    HPI Richard Wilkerson is a 49 y.o. male past medical history as below, notable for diverticulitis with perforation and abscess in August 2020, requiring IR drain, who presents to the emergency department for lower abdominal pain similar to prior episodes of diverticulitis.  This pain started several days ago and has been constant since onset.  He describes it as a sharp pain.  Located to the lower abdomen, mostly central. 5/10 in severity.  No radiation.  Worsened with movement and certain positional changes, improved with rest.  Denies any associated fevers, nausea, vomiting.  Does report some slight increased difficulty with bowel movements (constipation).  States symptoms feel similar to his prior episode of diverticulitis.  Denies any associated dysuria, hematuria, or history of nephrolithiasis.   Past Medical Hx Past Medical History:  Diagnosis Date  . Back pain   . Bilateral tennis elbow   . Cervical radiculopathy   . Depression   . Diverticulitis   . History of pneumonia   . Hypertension     Problem List Patient Active Problem List   Diagnosis Date Noted  . Overweight (BMI 25.0-29.9) 01/15/2015  . Backhand tennis elbow 06/30/2014  . Chronic back pain 06/30/2014  . Cervical radiculitis 06/30/2014  . H/O: pneumonia 06/30/2014  . History of hypertension 06/30/2014  . Tobacco use 06/30/2014  . Bleeding per rectum 06/30/2014    Past Surgical Hx Past Surgical History:  Procedure Laterality Date  . COLONOSCOPY WITH PROPOFOL N/A 12/12/2018   Procedure: COLONOSCOPY WITH PROPOFOL;  Surgeon: Sung Amabile, DO;  Location: [facility] ENDOSCOPY;  Service: General;  Laterality: N/A;    . TONSILLECTOMY      Medications Prior to Admission medications   Not on File    Allergies Patient has no known allergies.  Family Hx Family History  Problem Relation Age of Onset  . Heart disease Mother   . Cancer Father        Gallbladder  . Hypertension Father     Social Hx Social History   Tobacco Use  . Smoking status: Current Some Day Smoker    Packs/day: 0.25    Years: 4.00    Pack years: 1.00    Types: Cigarettes    Start date: 06/29/2009  . Smokeless tobacco: Never Used  Substance Use Topics  . Alcohol use: Yes    Alcohol/week: 8.0 standard drinks    Types: 8 Standard drinks or equivalent per week  . Drug use: No     Review of Systems  Constitutional: Negative for fever. Negative for chills. Eyes: Negative for visual changes. ENT: Negative for sore throat. Cardiovascular: Negative for chest pain. Respiratory: Negative for shortness of breath. Gastrointestinal: Positive for abdominal pain. Genitourinary: Negative for dysuria. Musculoskeletal: Negative for leg swelling. Skin: Negative for rash. Neurological: Negative for headaches.   Physical Exam  Vital Signs: ED Triage Vitals  Enc Vitals Group     BP 06/16/19 0939 (!) 125/95     Pulse Rate 06/16/19 0939 78     Resp 06/16/19 0939 16     Temp 06/16/19 0939 98.7 F (37.1 C)     Temp Source 06/16/19 0939 Oral     SpO2 06/16/19 0939 98 %     Weight  06/16/19 0937 169 lb 15.6 oz (77.1 kg)     Height 06/16/19 0937 5\' 8"  (1.727 m)     Head Circumference --      Peak Flow --      Pain Score 06/16/19 0937 5     Pain Loc --      Pain Edu? --      Excl. in [place]? --     Constitutional: Alert and oriented. Well appearing. NAD.  Head: Normocephalic. Atraumatic. Eyes: Conjunctivae clear. Sclera anicteric. Pupils equal and symmetric. Nose: No masses or lesions. No congestion or rhinorrhea. Mouth/Throat: Wearing mask.  Neck: No stridor. Trachea midline.  Cardiovascular: Normal rate, regular rhythm.  Extremities well perfused. Respiratory: Normal respiratory effort.  Lungs CTAB. Gastrointestinal: Soft. Non-distended.  Mild TTP in the lower central abdomen, no rebounding, guarding, rigidity.  Remainder of abdomen is soft nontender.  Bowel sounds present. Genitourinary: Deferred. Musculoskeletal: No lower extremity edema. No deformities. Neurologic:  Normal speech and language. No gross focal or lateralizing neurologic deficits are appreciated.  Skin: Skin is warm, dry and intact. No rash noted. Psychiatric: Mood and affect are appropriate for situation.    Radiology  Personally reviewed available imaging myself.   CT A/P - IMPRESSION:  1. Severe sigmoid colon diverticulitis with diverticular abscess, as  detailed above. Surgical consultation is recommended.    Procedures  Procedure(s) performed (including critical care):  Procedures   Initial Impression / Assessment and Plan / MDM / ED Course  49 y.o. male who presents to the ED for lower abdominal pain, similar to prior episodes of diverticulitis  Ddx: recurrent diverticulitis, colitis, UTI/cystitis, nephrolithiasis  Will plan for labs, imaging  Work up reveals mild leukocytosis to 12.5 and imaging reveals severe sigmoid diverticulitis with abscess.  IV antibiotics ordered. Discussed with surgery. Will admit. Updated patient on results and plan of care, he voices understanding and is in agreement.    _______________________________   As part of my medical decision making I have reviewed available labs, radiology tests, reviewed old records/performed chart review, discussed w/ consultants (general surgery).    Final Clinical Impression(s) / ED Diagnosis  Final diagnoses:  Lower abdominal pain  Diverticulitis  Abscess of sigmoid colon due to diverticulitis       Note:  This document was prepared using Dragon voice recognition software and may include unintentional dictation errors.   Lilia Pro.,  MD 06/16/19 1331

## 2019-06-16 NOTE — ED Notes (Signed)
Called for transport

## 2019-06-16 NOTE — H&P (Signed)
SURGICAL HISTORY AND PHYSICAL NOTE   HISTORY OF PRESENT ILLNESS (HPI):  49 y.o. male presented to [facility] ED for evaluation of abdominal pain.  He reports that the pain started 3 to 4 days ago.  Pain localized in the lower abdomen on the midportion.  There is no pain radiation to other part of the body.  Aggravating factor is applying pressure.  Alleviating factor is pain medication given at the ED.  Patient denies any fever or chills.   At the ED he had white blood cell count that shows leukocytosis.  CT scan of the abdomen shows diverticulitis with 3 cm abscess, pericolonic.  I personally evaluated the images.  Patient reports having history of similar episode a year ago treated at [facility].  He had percutaneous drainage placed.  Then was treated with IV antibiotic therapy and transition to oral antibiotic therapy.  He had a colonoscopy on November 2020.  No sign of malignancy.  I personally evaluated the report and images of the colonoscopy.  Surgery is consulted by Dr. Colon Branch in this context for evaluation and management of acute diverticulitis with abscess.  PAST MEDICAL HISTORY (PMH):  Past Medical History:  Diagnosis Date  . Back pain   . Bilateral tennis elbow   . Cervical radiculopathy   . Depression   . Diverticulitis   . History of pneumonia   . Hypertension      PAST SURGICAL HISTORY (PSH):  Past Surgical History:  Procedure Laterality Date  . COLONOSCOPY WITH PROPOFOL N/A 12/12/2018   Procedure: COLONOSCOPY WITH PROPOFOL;  Surgeon: Sung Amabile, DO;  Location: [facility] ENDOSCOPY;  Service: General;  Laterality: N/A;  . TONSILLECTOMY       MEDICATIONS:  Prior to Admission medications   Not on File     ALLERGIES:  No Known Allergies   SOCIAL HISTORY:  Social History   Socioeconomic History  . Marital status: Divorced    Spouse name: Not on file  . Number of children: Not on file  . Years of education: Not on file  . Highest education level: Not on file  Occupational  History  . Not on file  Tobacco Use  . Smoking status: Current Some Day Smoker    Packs/day: 0.25    Years: 4.00    Pack years: 1.00    Types: Cigarettes    Start date: 06/29/2009  . Smokeless tobacco: Never Used  Substance and Sexual Activity  . Alcohol use: Yes    Alcohol/week: 8.0 standard drinks    Types: 8 Standard drinks or equivalent per week  . Drug use: No  . Sexual activity: Yes    Partners: Female  Other Topics Concern  . Not on file  Social History Narrative  . Not on file   Social Determinants of Health   Financial Resource Strain:   . Difficulty of Paying Living Expenses:   Food Insecurity:   . Worried About Programme researcher, broadcasting/film/video in the Last Year:   . Barista in the Last Year:   Transportation Needs:   . Freight forwarder (Medical):   Marland Kitchen Lack of Transportation (Non-Medical):   Physical Activity:   . Days of Exercise per Week:   . Minutes of Exercise per Session:   Stress:   . Feeling of Stress :   Social Connections:   . Frequency of Communication with Friends and Family:   . Frequency of Social Gatherings with Friends and Family:   . Attends Religious Services:   .  Active Member of Clubs or Organizations:   . Attends Banker Meetings:   Marland Kitchen Marital Status:   Intimate Partner Violence:   . Fear of Current or Ex-Partner:   . Emotionally Abused:   Marland Kitchen Physically Abused:   . Sexually Abused:       FAMILY HISTORY:  Family History  Problem Relation Age of Onset  . Heart disease Mother   . Cancer Father        Gallbladder  . Hypertension Father      REVIEW OF SYSTEMS:  Constitutional: denies weight loss, fever, chills, or sweats  Eyes: denies any other vision changes, history of eye injury  ENT: denies sore throat, hearing problems  Respiratory: denies shortness of breath, wheezing  Cardiovascular: denies chest pain, palpitations  Gastrointestinal: Positive abdominal pain, Negative nausea and vomiting Genitourinary: denies  burning with urination or urinary frequency Musculoskeletal: denies any other joint pains or cramps  Skin: denies any other rashes or skin discolorations  Neurological: denies any other headache, dizziness, weakness  Psychiatric: denies any other depression, anxiety   All other review of systems were negative   VITAL SIGNS:  Temp:  [98.7 F (37.1 C)] 98.7 F (37.1 C) (05/24 0939) Pulse Rate:  [55-78] 65 (05/24 1429) Resp:  [16-20] 20 (05/24 1429) BP: (123-142)/(82-97) 142/97 (05/24 1429) SpO2:  [98 %-99 %] 99 % (05/24 1429) Weight:  [77.1 kg] 77.1 kg (05/24 0937)     Height: 5\' 8"  (172.7 cm) Weight: 77.1 kg BMI (Calculated): 25.85   INTAKE/OUTPUT:  This shift: No intake/output data recorded.  Last 2 shifts: @IOLAST2SHIFTS @   PHYSICAL EXAM:  Constitutional:  -- Normal body habitus  -- Awake, alert, and oriented x3  Eyes:  -- Pupils equally round and reactive to light  -- No scleral icterus  Ear, nose, and throat:  -- No jugular venous distension  Pulmonary:  -- No crackles  -- Equal breath sounds bilaterally -- Breathing non-labored at rest Cardiovascular:  -- S1, S2 present  -- No pericardial rubs Gastrointestinal:  -- Abdomen soft, mild tender to palpation mid abdomen, non-distended, no guarding or rebound tenderness -- No abdominal masses appreciated, pulsatile or otherwise  Musculoskeletal and Integumentary:  -- Wounds: None appreciated -- Extremities: B/L UE and LE FROM, hands and feet warm, no edema  Neurologic:  -- Motor function: intact and symmetric -- Sensation: intact and symmetric   Labs:  CBC Latest Ref Rng & Units 06/16/2019 12/02/2012  WBC 4.0 - 10.5 K/uL 12.5(H) 9.0  Hemoglobin 13.0 - 17.0 g/dL 06/18/2019 13/10/2012  Hematocrit 42.5 - 52.0 % 45.9 45.6  Platelets 150 - 400 K/uL 202 226   CMP Latest Ref Rng & Units 06/16/2019 06/30/2014 12/02/2012  Glucose 70 - 99 mg/dL 08/30/2014) 93 13/10/2012)  BUN 6 - 20 mg/dL 15 - 16  Creatinine 564(P - 1.24 mg/dL 329(J - 1.88   Sodium 4.16 - 145 mmol/L 136 - 137  Potassium 3.5 - 5.1 mmol/L 4.4 - 4.0  Chloride 98 - 111 mmol/L 104 - 106  CO2 22 - 32 mmol/L 25 - 30  Calcium 8.9 - 10.3 mg/dL 6.06) - 8.7  Total Protein 6.5 - 8.1 g/dL 7.2 - 7.3  Total Bilirubin 0.3 - 1.2 mg/dL 0.8 - 0.4  Alkaline Phos 38 - 126 U/L 71 - 76  AST 15 - 41 U/L 15 - 24  ALT 0 - 44 U/L 13 - 27     Imaging studies:  EXAM: CT ABDOMEN AND PELVIS WITH CONTRAST  TECHNIQUE: Multidetector CT imaging of the abdomen and pelvis was performed using the standard protocol following bolus administration of intravenous contrast.  CONTRAST:  166mL OMNIPAQUE IOHEXOL 300 MG/ML  SOLN  COMPARISON:  CT of the abdomen and pelvis 10/09/2018.  FINDINGS: Lower chest: Unremarkable.  Hepatobiliary: No suspicious cystic or solid hepatic lesions. No intra or extrahepatic biliary ductal dilatation. Gallbladder is normal in appearance.  Pancreas: No pancreatic mass. No pancreatic ductal dilatation. No pancreatic or peripancreatic fluid collections or inflammatory changes.  Spleen: Unremarkable.  Adrenals/Urinary Tract: Bilateral kidneys and adrenal glands are normal in appearance. No hydroureteronephrosis. Urinary bladder is normal in appearance.  Stomach/Bowel: Normal appearance of the stomach. No pathologic dilatation of small bowel or colon. Numerous colonic diverticulae are noted, particularly in the sigmoid colon. In the region of the mid sigmoid colon and adjacent sigmoid mesocolon there are extensive inflammatory changes indicative of an acute diverticulitis. In addition, axial image 66 of series 2 and coronal image 38 of series 5 demonstrates a well-defined 3.5 x 2.3 x 3.2 cm low-attenuation rim enhancing lesion containing fluid and gas within the superior aspect of the sigmoid mesocolon, indicative of a diverticular abscess. No definitive findings of frank perforation or confidently identified at this time. Normal  appendix.  Vascular/Lymphatic: No significant atherosclerotic disease, aneurysm or dissection noted in the abdominal or pelvic vasculature. No lymphadenopathy noted in the abdomen or pelvis.  Reproductive: Prostate gland and seminal vesicles are unremarkable in appearance.  Other: No significant volume of ascites.  No pneumoperitoneum.  Musculoskeletal: There are no aggressive appearing lytic or blastic lesions noted in the visualized portions of the skeleton.  IMPRESSION: 1. Severe sigmoid colon diverticulitis with diverticular abscess, as detailed above. Surgical consultation is recommended.   Electronically Signed   By: Vinnie Langton M.D.   On: 06/16/2019 12:59  Assessment/Plan:  49 y.o. male with complicated diverticulitis with pericolonic abscess, complicated by pertinent comorbidities including hypertension, previous episode of diverticulitis.  Patient with second episode of acute diverticulitis with pericolonic abscess.  Currently patient stable without peritoneal sign.  Due to leukocytosis and finding of abscess I recommend patient to treat as inpatient with IV antibiotic therapy.  I discussed the case with IR who feels that the abscess is in a very difficult position to be reach.  He did mention that if needed they can bring the patient down to the IR suite to scan him to see if there is a window for drainage.  At this moment there is no emergent surgery or procedure that needs to be done.  Will admit patient to surgical service for IV antibiotic therapy.  We will place patient on clear liquid diet.  We will start DVT prophylaxis.   Arnold Long, MD

## 2019-06-16 NOTE — ED Triage Notes (Signed)
C/O lower abdominal pain x 2-3 days.  Pain similar to episode of diverticulitis.  AAOx3.  Skin warm and dry. NAD

## 2019-06-17 ENCOUNTER — Encounter: Payer: Self-pay | Admitting: General Surgery

## 2019-06-17 MED ORDER — SODIUM CHLORIDE 0.9 % IV SOLN
INTRAVENOUS | Status: DC | PRN
  Administered 2019-06-17: 250 mL via INTRAVENOUS

## 2019-06-17 NOTE — Progress Notes (Signed)
Reported by Odis Hollingshead, NT that patient had refused using the urinal; instructed patient in the importance of monitoring I/O; that it was required my his Doctor; voiced understanding; urinal within reach. Windy Carina, RN6:52 AM 06/17/2019

## 2019-06-17 NOTE — Consult Note (Signed)
Chief Complaint: Patient was seen in consultation today for diverticular abscess.  Referring Physician(s): Sung Amabile  Supervising Physician: Malachy Moan  Patient Status: [facility] - In-pt  History of Present Illness: Richard Wilkerson is a 49 y.o. male with a past medical history significant for depression, HTN and diverticulitis with previous intra-abdominal abscess and drain placement 08/31/18 (removed 8/31) at [facility] who presented to the ED yesterday with complaints of lower abdominal pain x 2-3 days which felt similar to previous episodes of diverticulitis. Initial work up in the ED noted patient to be afebrile, mildly hypertensive, WBC 12.5. CT A/P with contrast was performed which noted severe sigmoid colon diverticulitis with a well-defined 3.5 x 2.3 x 3.2 cm low-attenuation rim enhancing lesion containing fluid and gas within the superior aspect of the sigmoid mesocolon indicative of diverticular abscess. General surgery was consulted for admission, upon their evaluation it was determined that no emergent surgery was indicated and plan was to begin IV antibiotics. IR has been consulted for possible percutaneous abscess and drain placement.  Richard Wilkerson was seen in his room, he reports continued abdominal pain which is about the same as when he arrived - he notes that this episode is different than his previous bout of diverticulitis because before he really only had pain with certain movements whereas this time the pain is more constant and present when he is just laying in bed. He reports fairly regular bowel movements but they are often small and hard to pass, he previously used stool softeners but is not sure if they were helpful. He is wondering when he will need surgery, when he can have a regular diet and when he can go home which we discussed will all be determined by general surgery. He denies any other complaints and is willing to proceed with aspiration/drain  placement.  Past Medical History:  Diagnosis Date  . Back pain   . Bilateral tennis elbow   . Cervical radiculopathy   . Depression   . Diverticulitis   . History of pneumonia   . Hypertension     Past Surgical History:  Procedure Laterality Date  . COLONOSCOPY WITH PROPOFOL N/A 12/12/2018   Procedure: COLONOSCOPY WITH PROPOFOL;  Surgeon: Sung Amabile, DO;  Location: [facility] ENDOSCOPY;  Service: General;  Laterality: N/A;  . TONSILLECTOMY      Allergies: Patient has no known allergies.  Medications: Prior to Admission medications   Not on File     Family History  Problem Relation Age of Onset  . Heart disease Mother   . Cancer Father        Gallbladder  . Hypertension Father     Social History   Socioeconomic History  . Marital status: Divorced    Spouse name: Not on file  . Number of children: Not on file  . Years of education: Not on file  . Highest education level: Not on file  Occupational History  . Not on file  Tobacco Use  . Smoking status: Current Some Day Smoker    Packs/day: 0.25    Years: 4.00    Pack years: 1.00    Types: Cigarettes    Start date: 06/29/2009  . Smokeless tobacco: Never Used  Substance and Sexual Activity  . Alcohol use: Yes    Alcohol/week: 8.0 standard drinks    Types: 8 Standard drinks or equivalent per week  . Drug use: No  . Sexual activity: Yes    Partners: Female  Other Topics  Concern  . Not on file  Social History Narrative  . Not on file   Social Determinants of Health   Financial Resource Strain:   . Difficulty of Paying Living Expenses:   Food Insecurity:   . Worried About Programme researcher, broadcasting/film/video in the Last Year:   . Barista in the Last Year:   Transportation Needs:   . Freight forwarder (Medical):   Marland Kitchen Lack of Transportation (Non-Medical):   Physical Activity:   . Days of Exercise per Week:   . Minutes of Exercise per Session:   Stress:   . Feeling of Stress :   Social Connections:   .  Frequency of Communication with Friends and Family:   . Frequency of Social Gatherings with Friends and Family:   . Attends Religious Services:   . Active Member of Clubs or Organizations:   . Attends Banker Meetings:   Marland Kitchen Marital Status:      Review of Systems: A 12 point ROS discussed and pertinent positives are indicated in the HPI above.  All other systems are negative.  Review of Systems  Constitutional: Negative for chills and fever.  Respiratory: Negative for cough and shortness of breath.   Cardiovascular: Negative for chest pain.  Gastrointestinal: Positive for abdominal pain and constipation. Negative for blood in stool, diarrhea, nausea and vomiting.  Genitourinary: Negative for hematuria.  Musculoskeletal: Negative for back pain.  Neurological: Negative for dizziness and headaches.    Vital Signs: BP (!) 132/91 (BP Location: Left Arm)   Pulse (!) 59   Temp 98 F (36.7 C) (Oral)   Resp 14   Ht 5\' 8"  (1.727 m)   Wt 169 lb 15.6 oz (77.1 kg)   SpO2 100%   BMI 25.84 kg/m   Physical Exam Vitals and nursing note reviewed.  Constitutional:      General: He is not in acute distress.    Comments: Very pleasant, talkative, good historian.  HENT:     Head: Normocephalic.     Mouth/Throat:     Mouth: Mucous membranes are moist.     Pharynx: Oropharynx is clear. No oropharyngeal exudate or posterior oropharyngeal erythema.  Cardiovascular:     Rate and Rhythm: Normal rate and regular rhythm.  Pulmonary:     Effort: Pulmonary effort is normal.     Breath sounds: Normal breath sounds.  Abdominal:     General: There is no distension.     Palpations: Abdomen is soft.     Tenderness: There is abdominal tenderness (lower abdomen R>L).  Skin:    General: Skin is warm and dry.  Neurological:     Mental Status: He is alert and oriented to person, place, and time.  Psychiatric:        Mood and Affect: Mood normal.        Behavior: Behavior normal.         Thought Content: Thought content normal.        Judgment: Judgment normal.      MD Evaluation Airway: WNL Heart: WNL Abdomen: WNL Chest/ Lungs: WNL ASA  Classification: 2 Mallampati/Airway Score: One   Imaging: CT Abdomen Pelvis W Contrast  Result Date: 06/16/2019 CLINICAL DATA:  49 year old male with history of abdominal pain. Elevated white blood cell count. Evaluate for potential diverticulitis. EXAM: CT ABDOMEN AND PELVIS WITH CONTRAST TECHNIQUE: Multidetector CT imaging of the abdomen and pelvis was performed using the standard protocol following bolus administration of intravenous contrast.  CONTRAST:  191mL OMNIPAQUE IOHEXOL 300 MG/ML  SOLN COMPARISON:  CT of the abdomen and pelvis 10/09/2018. FINDINGS: Lower chest: Unremarkable. Hepatobiliary: No suspicious cystic or solid hepatic lesions. No intra or extrahepatic biliary ductal dilatation. Gallbladder is normal in appearance. Pancreas: No pancreatic mass. No pancreatic ductal dilatation. No pancreatic or peripancreatic fluid collections or inflammatory changes. Spleen: Unremarkable. Adrenals/Urinary Tract: Bilateral kidneys and adrenal glands are normal in appearance. No hydroureteronephrosis. Urinary bladder is normal in appearance. Stomach/Bowel: Normal appearance of the stomach. No pathologic dilatation of small bowel or colon. Numerous colonic diverticulae are noted, particularly in the sigmoid colon. In the region of the mid sigmoid colon and adjacent sigmoid mesocolon there are extensive inflammatory changes indicative of an acute diverticulitis. In addition, axial image 66 of series 2 and coronal image 38 of series 5 demonstrates a well-defined 3.5 x 2.3 x 3.2 cm low-attenuation rim enhancing lesion containing fluid and gas within the superior aspect of the sigmoid mesocolon, indicative of a diverticular abscess. No definitive findings of frank perforation or confidently identified at this time. Normal appendix. Vascular/Lymphatic:  No significant atherosclerotic disease, aneurysm or dissection noted in the abdominal or pelvic vasculature. No lymphadenopathy noted in the abdomen or pelvis. Reproductive: Prostate gland and seminal vesicles are unremarkable in appearance. Other: No significant volume of ascites.  No pneumoperitoneum. Musculoskeletal: There are no aggressive appearing lytic or blastic lesions noted in the visualized portions of the skeleton. IMPRESSION: 1. Severe sigmoid colon diverticulitis with diverticular abscess, as detailed above. Surgical consultation is recommended. Electronically Signed   By: Vinnie Langton M.D.   On: 06/16/2019 12:59    Labs:  CBC: Recent Labs    06/16/19 0939  WBC 12.5*  HGB 15.7  HCT 45.9  PLT 202    COAGS: No results for input(s): INR, APTT in the last 8760 hours.  BMP: Recent Labs    06/16/19 0939  NA 136  K 4.4  CL 104  CO2 25  GLUCOSE 114*  BUN 15  CALCIUM 8.8*  CREATININE 1.11  GFRNONAA >60  GFRAA >60    LIVER FUNCTION TESTS: Recent Labs    06/16/19 0939  BILITOT 0.8  AST 15  ALT 13  ALKPHOS 71  PROT 7.2  ALBUMIN 4.2    TUMOR MARKERS: No results for input(s): AFPTM, CEA, CA199, CHROMGRNA in the last 8760 hours.  Assessment and Plan:  49 y/o M with history of diverticulitis with diverticular abscess requiring drain placement in IR at [facility] on 08/31/18 (removed 09/23/18) who presented to [facility] ED yesterday with complaints of abdominal pain x 3 days. CT A/P showed a well-defined 3.5 x 2.3 x 3.2 cm low-attenuation rim enhancing lesion containing fluid and gas within the superior aspect of the sigmoid mesocolon indicative of diverticular abscess. He has been admitted by general surgery for IV antibiotics and IR has been asked to evaluate patient for possible percutaneous abscess aspiration/drain placement. Patient history and imaging reviewed by Dr. Laurence Ferrari today who agrees to bring patient to CT tomorrow (5/26) for attempt at drain placement although  the location of the abscess may preclude safe percutaneous drainage. The possibility that we may be unable to place a drain based on the location of the abscess was discussed with the patient today who states understanding and he remains agreeable to proceed.  Patient to be NPO at midnight, hold anticoagulation until post procedure, AM labs pending, currently receiving Zosyn + Flagyl per primary team.   Risks and benefits discussed with the patient including  bleeding, infection, damage to adjacent structures, bowel perforation/fistula connection, and sepsis.  All of the patient's questions were answered, patient is agreeable to proceed.  Consent signed and in chart.  Thank you for this interesting consult.  I greatly enjoyed meeting Richard Wilkerson and look forward to participating in their care.  A copy of this report was sent to the requesting provider on this date.  Electronically Signed: Villa Herb, PA-C 06/17/2019, 3:02 PM   I spent a total of 40 Minutesin face to face in clinical consultation, greater than 50% of which was counseling/coordinating care for diverticular abscess drain.

## 2019-06-17 NOTE — Progress Notes (Signed)
Subjective:  CC: Richard Wilkerson is a 49 y.o. male  Hospital stay day 1,   recurrent diverticular abscess  HPI: No issues overnight.  Pain better, tolerating clear liquid diet  ROS:  General: Denies weight loss, weight gain, fatigue, fevers, chills, and night sweats. Heart: Denies chest pain, palpitations, racing heart, irregular heartbeat, leg pain or swelling, and decreased activity tolerance. Respiratory: Denies breathing difficulty, shortness of breath, wheezing, cough, and sputum. GI: Denies change in appetite, heartburn, nausea, vomiting, constipation, diarrhea, and blood in stool. GU: Denies difficulty urinating, pain with urinating, urgency, frequency, blood in urine.   Objective:   Temp:  [98 F (36.7 C)-98.6 F (37 C)] 98 F (36.7 C) (05/25 1208) Pulse Rate:  [59-88] 59 (05/25 1208) Resp:  [14-18] 14 (05/25 1208) BP: (109-132)/(78-91) 132/91 (05/25 1208) SpO2:  [96 %-100 %] 100 % (05/25 1208)     Height: 5\' 8"  (172.7 cm) Weight: 77.1 kg BMI (Calculated): 25.85   Intake/Output this shift:   Intake/Output Summary (Last 24 hours) at 06/17/2019 1505 Last data filed at 06/17/2019 1300 Gross per 24 hour  Intake 1238.16 ml  Output 1000 ml  Net 238.16 ml    Constitutional :  alert, cooperative, appears stated age and no distress  Respiratory:  clear to auscultation bilaterally  Cardiovascular:  regular rate and rhythm  Gastrointestinal: soft, no guarding, minimal TTP to suprapubic, LLQ region.   Skin: Cool and moist.   Psychiatric: Normal affect, non-agitated, not confused       LABS:  CMP Latest Ref Rng & Units 06/16/2019 06/30/2014 12/02/2012  Glucose 70 - 99 mg/dL 13/10/2012) 93 350(K)  BUN 6 - 20 mg/dL 15 - 16  Creatinine 938(H - 1.24 mg/dL 8.29 - 9.37  Sodium 1.69 - 145 mmol/L 136 - 137  Potassium 3.5 - 5.1 mmol/L 4.4 - 4.0  Chloride 98 - 111 mmol/L 104 - 106  CO2 22 - 32 mmol/L 25 - 30  Calcium 8.9 - 10.3 mg/dL 678) - 8.7  Total Protein 6.5 - 8.1 g/dL 7.2 -  7.3  Total Bilirubin 0.3 - 1.2 mg/dL 0.8 - 0.4  Alkaline Phos 38 - 126 U/L 71 - 76  AST 15 - 41 U/L 15 - 24  ALT 0 - 44 U/L 13 - 27   CBC Latest Ref Rng & Units 06/16/2019 12/02/2012  WBC 4.0 - 10.5 K/uL 12.5(H) 9.0  Hemoglobin 13.0 - 17.0 g/dL 13/10/2012 10.1  Hematocrit 75.1 - 52.0 % 45.9 45.6  Platelets 150 - 400 K/uL 202 226    RADS: n/a Assessment:   Recurrent diverticular abscess.  Discussed in great length the options moving forward for management of his recurrent diverticular abscess.  My initial recommendation was repeat attempted IR drainage, and if successful, irritable colon resection for 6 weeks down the road once this episodes of inflammation has minimized.  I explained to him that this will minimize any perioperative complication risk including possible ostomy creation.  Second choice if IR drainage is unsuccessful is completion treatment with oral antibiotics and attempt interval colon resection again few weeks later, time for inflammation to heal.  I explained to him that this is less than ideal due to the high risk of another flare during the waiting time, but can be considered as an option since he is currently minimally symptomatic, and really would like to be discharged from the hospital.  Third option is to proceed with surgery during this admission if IR drain placement is unsuccessful, understanding that he  is at a increased risk for perioperative risk complication, including likely an ostomy creation.  Patient understands an ostomy still may be a possibility even after the interval surgery, less likely.  At this time he would like to proceed with attempted IR drainage and go from there.  IR radiologist on duty for tomorrow was notified, and he was agreeable to attempt placement.  N.p.o. after midnight, coag labs in the a.m., and Lovenox will be held in preparation for the procedure.  RN notified of plan as well.

## 2019-06-18 ENCOUNTER — Inpatient Hospital Stay: Payer: BC Managed Care – PPO

## 2019-06-18 LAB — BASIC METABOLIC PANEL
Anion gap: 9 (ref 5–15)
BUN: 8 mg/dL (ref 6–20)
CO2: 23 mmol/L (ref 22–32)
Calcium: 8.7 mg/dL — ABNORMAL LOW (ref 8.9–10.3)
Chloride: 104 mmol/L (ref 98–111)
Creatinine, Ser: 1.04 mg/dL (ref 0.61–1.24)
GFR calc Af Amer: >60 mL/min (ref >60)
GFR calc non Af Amer: >60 mL/min (ref >60)
Glucose, Bld: 112 mg/dL — ABNORMAL HIGH (ref 70–99)
Potassium: 3.8 mmol/L (ref 3.5–5.1)
Sodium: 136 mmol/L (ref 135–145)

## 2019-06-18 LAB — PHOSPHORUS: Phosphorus: 3.1 mg/dL (ref 2.5–4.6)

## 2019-06-18 LAB — CBC WITH DIFFERENTIAL/PLATELET
Abs Immature Granulocytes: 0.06 10*3/uL (ref 0.00–0.07)
Basophils Absolute: 0 10*3/uL (ref 0.0–0.1)
Basophils Relative: 0 %
Eosinophils Absolute: 0.1 10*3/uL (ref 0.0–0.5)
Eosinophils Relative: 1 %
HCT: 43.4 % (ref 39.0–52.0)
Hemoglobin: 15.5 g/dL (ref 13.0–17.0)
Immature Granulocytes: 1 %
Lymphocytes Relative: 15 %
Lymphs Abs: 1.7 10*3/uL (ref 0.7–4.0)
MCH: 31.4 pg (ref 26.0–34.0)
MCHC: 35.7 g/dL (ref 30.0–36.0)
MCV: 87.9 fL (ref 80.0–100.0)
Monocytes Absolute: 1.1 10*3/uL — ABNORMAL HIGH (ref 0.1–1.0)
Monocytes Relative: 9 %
Neutro Abs: 8.8 10*3/uL — ABNORMAL HIGH (ref 1.7–7.7)
Neutrophils Relative %: 74 %
Platelets: 227 10*3/uL (ref 150–400)
RBC: 4.94 MIL/uL (ref 4.22–5.81)
RDW: 11.5 % (ref 11.5–15.5)
WBC: 11.8 10*3/uL — ABNORMAL HIGH (ref 4.0–10.5)
nRBC: 0 % (ref 0.0–0.2)

## 2019-06-18 LAB — PROTIME-INR
INR: 1.2 (ref 0.8–1.2)
Prothrombin Time: 14.7 seconds (ref 11.4–15.2)

## 2019-06-18 LAB — MAGNESIUM: Magnesium: 2.2 mg/dL (ref 1.7–2.4)

## 2019-06-18 MED ORDER — FENTANYL CITRATE (PF) 100 MCG/2ML IJ SOLN
INTRAMUSCULAR | Status: AC | PRN
  Administered 2019-06-18 (×2): 50 ug via INTRAVENOUS

## 2019-06-18 MED ORDER — SODIUM CHLORIDE 0.9% FLUSH
5.0000 mL | Freq: Three times a day (TID) | INTRAVENOUS | Status: DC
  Administered 2019-06-18 – 2019-06-19 (×3): 5 mL

## 2019-06-18 MED ORDER — FENTANYL CITRATE (PF) 100 MCG/2ML IJ SOLN
INTRAMUSCULAR | Status: AC
  Filled 2019-06-18: qty 2

## 2019-06-18 MED ORDER — ENOXAPARIN SODIUM 40 MG/0.4ML ~~LOC~~ SOLN: 40.0000 mg | SUBCUTANEOUS | Status: DC

## 2019-06-18 MED ORDER — MIDAZOLAM HCL 2 MG/2ML IJ SOLN
INTRAMUSCULAR | Status: AC | PRN
  Administered 2019-06-18 (×3): 1 mg via INTRAVENOUS

## 2019-06-18 MED ORDER — MIDAZOLAM HCL 5 MG/5ML IJ SOLN
INTRAMUSCULAR | Status: AC
  Filled 2019-06-18: qty 5

## 2019-06-18 NOTE — Progress Notes (Signed)
Patient clinically stable post Abscess drain placement per DR Archer Asa, tolerated well, vitals stable pre and post procedure. Denies complaints at this time. Received Versed 3mg  along with Fentanyl IV for procedure. Dressing dry/intact to suprapubic site, red/purulent fluid removed from drain, attached to JP drain system. Report given to [place] Rn at bedside on 2C.

## 2019-06-18 NOTE — Procedures (Signed)
Interventional Radiology Procedure Note  Procedure: CT guided placement of a 36F drain  Complications: None  Estimated Blood Loss: None  Recommendations: - Drain to JP bulb - Flush once per shift    Signed,  Sterling Big, MD

## 2019-06-18 NOTE — Progress Notes (Signed)
Temp 101.4; Dr. Tonna Boehringer notified, via secure chat (busy in OR), of temp and treatment given; Windy Carina, RN 8:05 PM 06/18/2019

## 2019-06-19 LAB — CBC WITH DIFFERENTIAL/PLATELET
Abs Immature Granulocytes: 0.06 10*3/uL (ref 0.00–0.07)
Basophils Absolute: 0 10*3/uL (ref 0.0–0.1)
Basophils Relative: 0 %
Eosinophils Absolute: 0.1 10*3/uL (ref 0.0–0.5)
Eosinophils Relative: 1 %
HCT: 43.4 % (ref 39.0–52.0)
Hemoglobin: 14.8 g/dL (ref 13.0–17.0)
Immature Granulocytes: 1 %
Lymphocytes Relative: 14 %
Lymphs Abs: 1.6 10*3/uL (ref 0.7–4.0)
MCH: 30.5 pg (ref 26.0–34.0)
MCHC: 34.1 g/dL (ref 30.0–36.0)
MCV: 89.5 fL (ref 80.0–100.0)
Monocytes Absolute: 1 10*3/uL (ref 0.1–1.0)
Monocytes Relative: 9 %
Neutro Abs: 9 10*3/uL — ABNORMAL HIGH (ref 1.7–7.7)
Neutrophils Relative %: 75 %
Platelets: 226 10*3/uL (ref 150–400)
RBC: 4.85 MIL/uL (ref 4.22–5.81)
RDW: 11.5 % (ref 11.5–15.5)
WBC: 11.8 10*3/uL — ABNORMAL HIGH (ref 4.0–10.5)
nRBC: 0 % (ref 0.0–0.2)

## 2019-06-19 LAB — BASIC METABOLIC PANEL
Anion gap: 7 (ref 5–15)
BUN: 9 mg/dL (ref 6–20)
CO2: 24 mmol/L (ref 22–32)
Calcium: 8.4 mg/dL — ABNORMAL LOW (ref 8.9–10.3)
Chloride: 105 mmol/L (ref 98–111)
Creatinine, Ser: 0.96 mg/dL (ref 0.61–1.24)
GFR calc Af Amer: >60 mL/min (ref >60)
GFR calc non Af Amer: >60 mL/min (ref >60)
Glucose, Bld: 115 mg/dL — ABNORMAL HIGH (ref 70–99)
Potassium: 3.8 mmol/L (ref 3.5–5.1)
Sodium: 136 mmol/L (ref 135–145)

## 2019-06-19 LAB — MAGNESIUM: Magnesium: 2 mg/dL (ref 1.7–2.4)

## 2019-06-19 LAB — PHOSPHORUS: Phosphorus: 2.8 mg/dL (ref 2.5–4.6)

## 2019-06-19 MED ORDER — IBUPROFEN 800 MG PO TABS: 800.0000 mg | ORAL_TABLET | Freq: Three times a day (TID) | ORAL | 0 refills | Status: AC | PRN

## 2019-06-19 MED ORDER — SODIUM CHLORIDE FLUSH 0.9 % IV SOLN: 5.0000 mL | Freq: Three times a day (TID) | INTRAVENOUS | 0 refills | Status: AC

## 2019-06-19 MED ORDER — AMOXICILLIN-POT CLAVULANATE 875-125 MG PO TABS: 1.0000 | ORAL_TABLET | Freq: Two times a day (BID) | ORAL | 0 refills | Status: AC

## 2019-06-19 MED ORDER — HYDROCODONE-ACETAMINOPHEN 5-325 MG PO TABS: 1.0000 | ORAL_TABLET | Freq: Four times a day (QID) | ORAL | 0 refills | Status: AC | PRN

## 2019-06-19 MED ORDER — DOCUSATE SODIUM 100 MG PO CAPS
100.0000 mg | ORAL_CAPSULE | Freq: Two times a day (BID) | ORAL | 0 refills | Status: AC | PRN
Start: 2019-06-19 — End: 2019-06-29

## 2019-06-19 MED ORDER — ACETAMINOPHEN 325 MG PO TABS
650.0000 mg | ORAL_TABLET | Freq: Three times a day (TID) | ORAL | 0 refills | Status: AC | PRN
Start: 2019-06-19 — End: 2019-07-19

## 2019-06-19 NOTE — Progress Notes (Signed)
Suprapubic JP drain flushed demonstrated for patient; patient stated that he had to do this before, for about a month; JP drain care instructions in writing, given to patient; voiced understanding. Flushes given for flushing three times and day and instructed to record JP output each time and take the record to his followup appointment. Windy Carina, RN 7:08 AM 06/19/2019

## 2019-06-19 NOTE — Progress Notes (Signed)
Pt discharged per MD order. IV removed. Pt verbalized understanding of JP care. Discharge instructions reviewed with pt and pt verbalized understanding with all questions answered to pt satisfaction. Pt declined for staff to wheel him to the front door and chose to walk.

## 2019-06-20 NOTE — Discharge Summary (Signed)
Physician Discharge Summary  Patient ID: Richard Wilkerson MRN: [id number] DOB/AGE: [birth year] 49 y.o.  Admit date: 06/16/2019 Discharge date: 06/19/2019  Admission Diagnoses: Acute diverticulitis with abscess Discharge Diagnoses:  Same as above  Discharged Condition: good  Hospital Course: Admitted for above.  Recurrent episodes so had an extensive discussion about the possible treatment options after symptoms were mainly resolved after an overnight stay of IV fluid and IV antibiotics.  We decided to do a repeat drain placement and then subsequent elective colon resection to minimize the risk of perioperative complications as well as possible need for ostomy.  IR consulted for drain placement which was successful, patient recovered well afterwards.  At time of discharge drain had minimal bloody output, tolerating a diet, and pain was well controlled.  At home with IV antibiotics and consider interval colon resection in 4 to 6 weeks.  All questions were addressed and patient verbalized understanding and was in agreement with plan at time of discharge.  Consults: IR  Discharge Exam: Blood pressure 124/90, pulse 85, temperature 98.9 F (37.2 C), temperature source Oral, resp. rate 16, height 5\' 8"  (1.727 m), weight 77.1 kg, SpO2 97 %. General appearance: alert, cooperative and no distress GI: Soft, no guarding, minimal tenderness to palpation in suprapubic region and the drain insertion site.  Disposition:  Discharge disposition: 01-Home or Self Care       Discharge Instructions    Discharge patient   Complete by: As directed    Discharge disposition: 01-Home or Self Care   Discharge patient date: 06/19/2019     Allergies as of 06/19/2019   No Known Allergies     Medication List    TAKE these medications   acetaminophen 325 MG tablet Commonly known as: Tylenol Take 2 tablets (650 mg total) by mouth every 8 (eight) hours as needed for mild pain.   amoxicillin-clavulanate  875-125 MG tablet Commonly known as: Augmentin Take 1 tablet by mouth 2 (two) times daily for 7 days.   docusate sodium 100 MG capsule Commonly known as: Colace Take 1 capsule (100 mg total) by mouth 2 (two) times daily as needed for up to 10 days for mild constipation.   HYDROcodone-acetaminophen 5-325 MG tablet Commonly known as: Norco Take 1 tablet by mouth every 6 (six) hours as needed for up to 6 doses for moderate pain.   ibuprofen 800 MG tablet Commonly known as: ADVIL Take 1 tablet (800 mg total) by mouth every 8 (eight) hours as needed for mild pain or moderate pain.   sodium chloride flush 0.9 % Soln injection 5 mLs by Intracatheter route in the morning, at noon, and at bedtime. Flush tube as directed three times a day      Follow-up Information    Lillyauna Jenkinson, DO. Go on 07/02/2019.   Specialty: Surgery Why: 10:45am appointment Contact information: [address] [place] [phone]            Total time spent arranging discharge was >47min. Signed: 31m 06/20/2019, 10:05 AM

## 2019-06-21 LAB — AEROBIC/ANAEROBIC CULTURE W GRAM STAIN (SURGICAL/DEEP WOUND): Special Requests: NORMAL

## 2019-07-02 ENCOUNTER — Other Ambulatory Visit: Payer: Self-pay | Admitting: Surgery

## 2019-07-02 ENCOUNTER — Other Ambulatory Visit (HOSPITAL_COMMUNITY): Payer: Self-pay | Admitting: Interventional Radiology

## 2019-07-02 DIAGNOSIS — K5732 Diverticulitis of large intestine without perforation or abscess without bleeding: Secondary | ICD-10-CM

## 2019-07-03 ENCOUNTER — Ambulatory Visit (HOSPITAL_COMMUNITY): Payer: BC Managed Care – PPO

## 2020-11-06 IMAGING — CT CT IMAGE GUIDED DRAINAGE BY PERCUTANEOUS CATHETER
1 of 3 series · 13 of 32 positions shown, 18 images · non-contrast
Comparison: none

INDICATION: 48-year-old male with persistent diverticular abscess. He presents
for CT-guided drain placement.

[Series 2: i-spiral 5.0 b30f · axial · 0.74mm/px · z∈[-281,-152]mm · 13 of 43 slices shown, 18 images]
[im 3/43  soft-tissue]
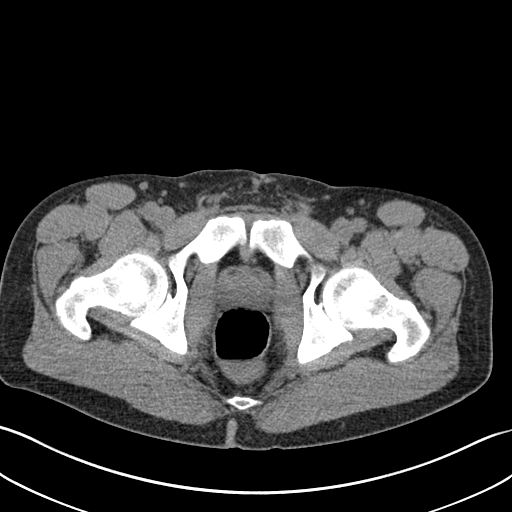
[im 3/43  bone]
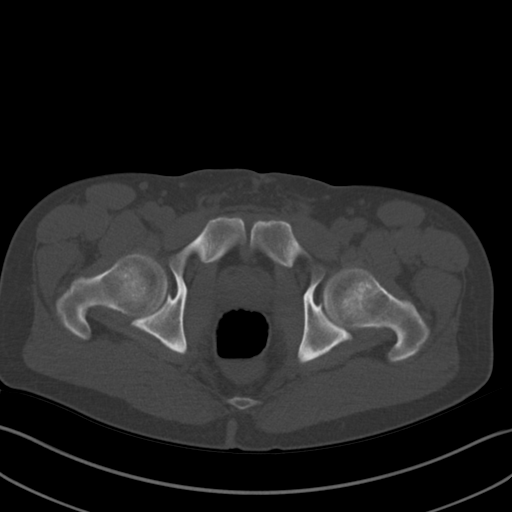
[im 6/43  soft-tissue]
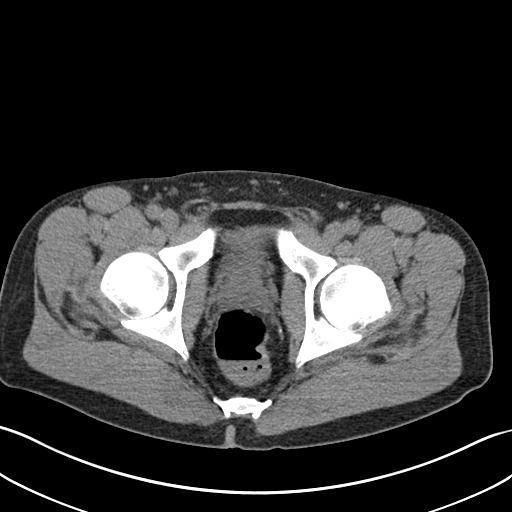
[im 11/43  soft-tissue]
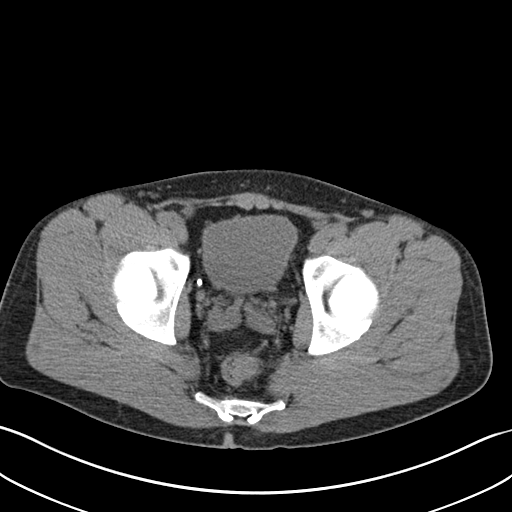
[im 14/43  soft-tissue]
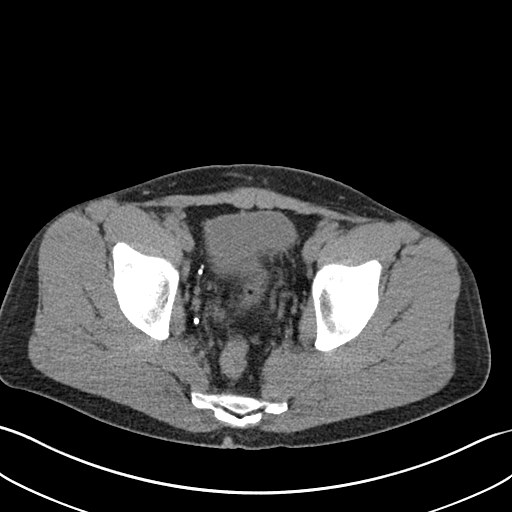
[im 16/43  soft-tissue]
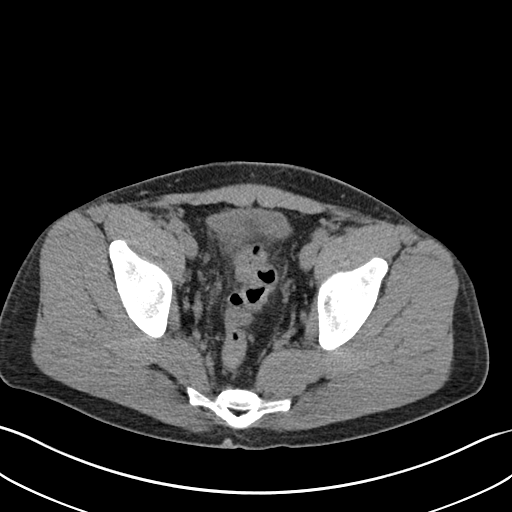
[im 19/43  soft-tissue]
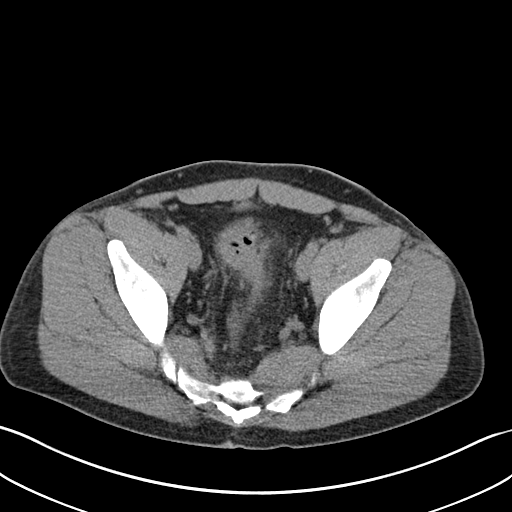
[im 24/43  soft-tissue]
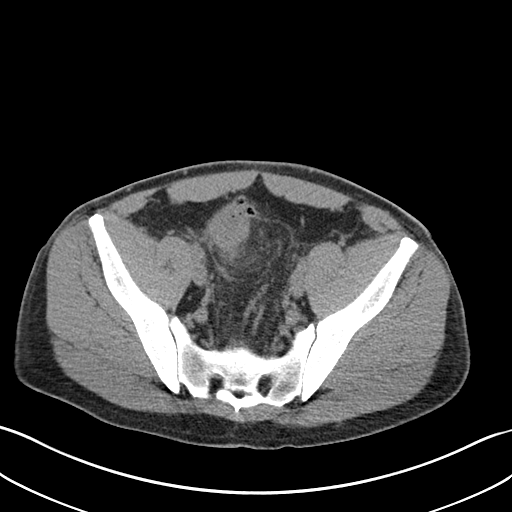
[im 27/43  soft-tissue]
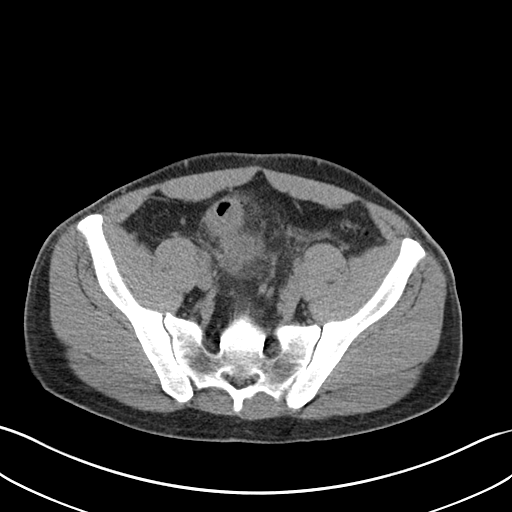
[im 29/43  soft-tissue]
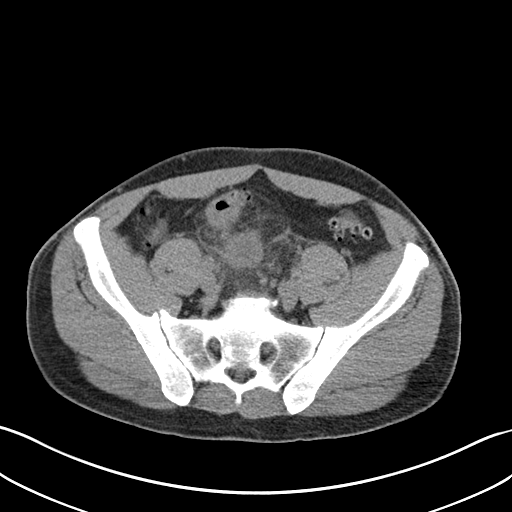
[im 29/43  bone]
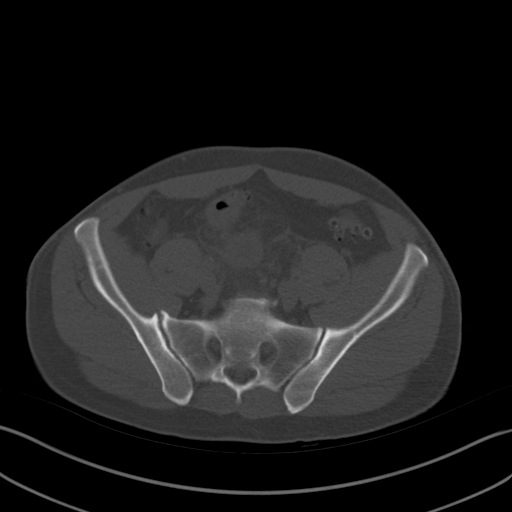
[im 32/43  soft-tissue]
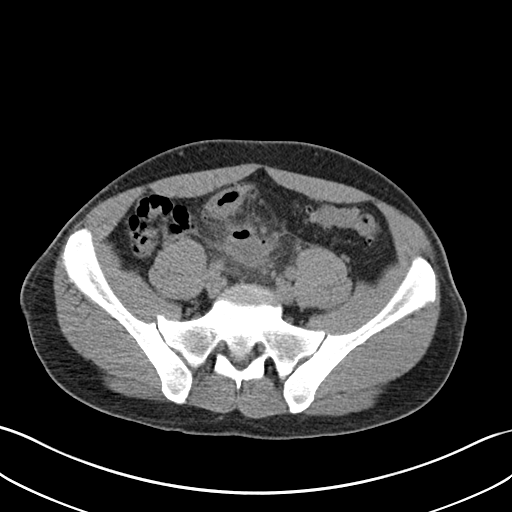
[im 32/43  lung]
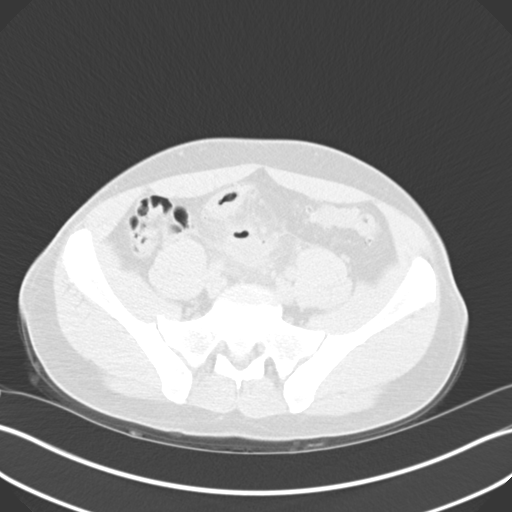
[im 35/43  lung]
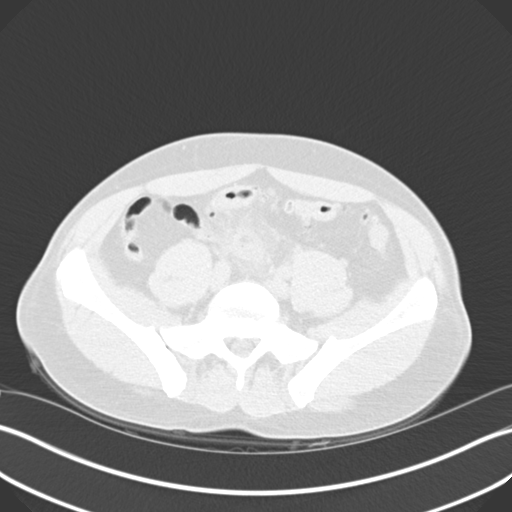
[im 37/43  soft-tissue]
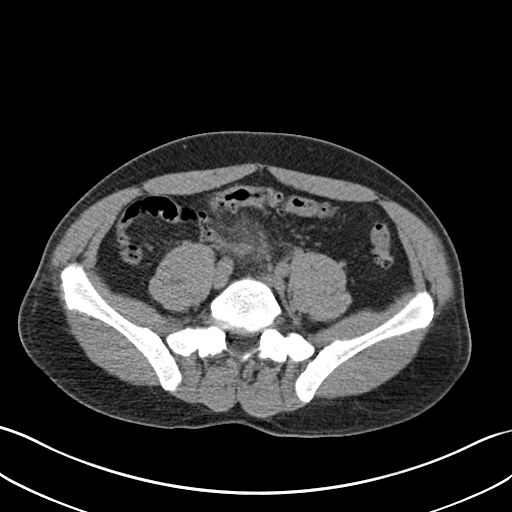
[im 37/43  lung]
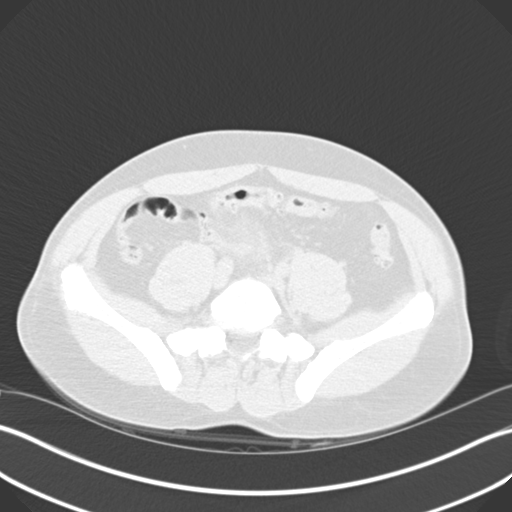
[im 40/43  soft-tissue]
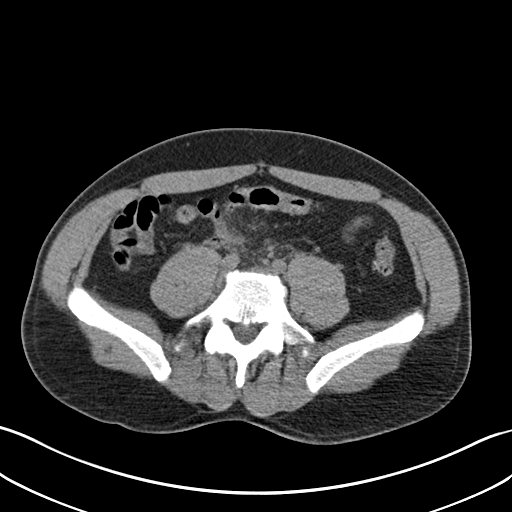
[im 40/43  lung]
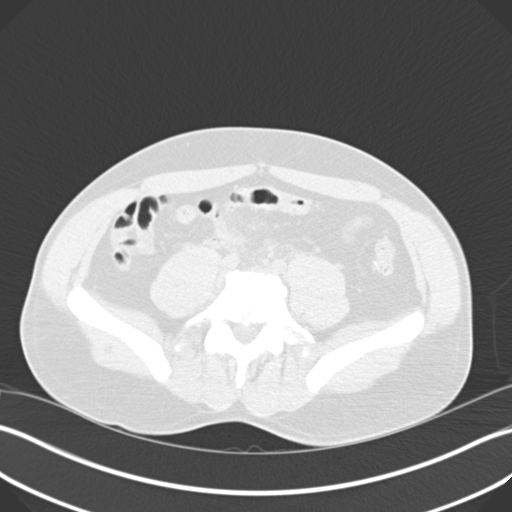

[13 of 32 positions shown; findings below may reference images not displayed]

EXAM:
CT-guided drain placement

MEDICATIONS:
The patient is currently admitted to the hospital and receiving
intravenous antibiotics. The antibiotics were administered within an
appropriate time frame prior to the initiation of the procedure.

ANESTHESIA/SEDATION:
Fentanyl 100 mcg IV; Versed 3 mg IV

Moderate Sedation Time:  10 minutes

The patient was continuously monitored during the procedure by the
interventional radiology nurse under my direct supervision.

COMPLICATIONS:
None immediate.

PROCEDURE:
Informed written consent was obtained from the patient after a
thorough discussion of the procedural risks, benefits and
alternatives. All questions were addressed. A timeout was performed
prior to the initiation of the procedure.

A planning axial CT scan was performed. The abscess was successfully
identified. There is a suitable window for percutaneous drain
placement. The overlying skin was marked and then cleansed in the
sterile fashion using chlorhexidine skin prep. Local anesthesia was
attained by infiltration with 1% lidocaine. A small dermatotomy was
made. Under intermittent CT guidance, an 18 gauge trocar needle was
carefully advanced through the median raphe a of the anterior rectus
abdominus muscles, through the sigmoid mesocolon and into the fluid
and gas collection.

A 0.035 wire was then coiled in the collection. The skin tract was
dilated to 12 French. [REDACTED] French all-purpose drainage catheter
was advanced over the wire and formed. Aspiration yields 10 mL of
thick purulent material. The sample was sent for Gram stain and
culture.

The catheter was then gently flushed and connected to JP bulb
suction. The catheter was secured to the skin with Prolene suture.
The patient tolerated the procedure well.
IMPRESSION: Successful placement of a 12 French drainage catheter into the
diverticular abscess. Aspiration yields a 10 mL thick, purulent
material which was sent for Gram stain and culture.
